# Patient Record
Sex: Female | Born: 1965 | Race: White | Hispanic: No | Smoking: Former smoker
Health system: Southern US, Community
[De-identification: ages and names within clinical notes are randomized; demographics above are authoritative.]

## PROBLEM LIST (undated history)

## (undated) DIAGNOSIS — A0472 Enterocolitis due to Clostridium difficile, not specified as recurrent: Secondary | ICD-10-CM

## (undated) DIAGNOSIS — K2270 Barrett's esophagus without dysplasia: Secondary | ICD-10-CM

## (undated) DIAGNOSIS — M519 Unspecified thoracic, thoracolumbar and lumbosacral intervertebral disc disorder: Secondary | ICD-10-CM

## (undated) DIAGNOSIS — B9681 Helicobacter pylori [H. pylori] as the cause of diseases classified elsewhere: Secondary | ICD-10-CM

## (undated) DIAGNOSIS — D509 Iron deficiency anemia, unspecified: Secondary | ICD-10-CM

## (undated) DIAGNOSIS — K279 Peptic ulcer, site unspecified, unspecified as acute or chronic, without hemorrhage or perforation: Secondary | ICD-10-CM

## (undated) DIAGNOSIS — G43909 Migraine, unspecified, not intractable, without status migrainosus: Secondary | ICD-10-CM

## (undated) DIAGNOSIS — Z21 Asymptomatic human immunodeficiency virus [HIV] infection status: Secondary | ICD-10-CM

## (undated) DIAGNOSIS — E876 Hypokalemia: Secondary | ICD-10-CM

## (undated) DIAGNOSIS — B370 Candidal stomatitis: Secondary | ICD-10-CM

## (undated) DIAGNOSIS — R197 Diarrhea, unspecified: Secondary | ICD-10-CM

## (undated) DIAGNOSIS — B20 Human immunodeficiency virus [HIV] disease: Secondary | ICD-10-CM

## (undated) DIAGNOSIS — F419 Anxiety disorder, unspecified: Secondary | ICD-10-CM

## (undated) DIAGNOSIS — Z9289 Personal history of other medical treatment: Secondary | ICD-10-CM

## (undated) DIAGNOSIS — R609 Edema, unspecified: Secondary | ICD-10-CM

## (undated) DIAGNOSIS — K2970 Gastritis, unspecified, without bleeding: Secondary | ICD-10-CM

## (undated) HISTORY — DX: Diarrhea, unspecified: R19.7

## (undated) HISTORY — DX: Asymptomatic human immunodeficiency virus (hiv) infection status: Z21

## (undated) HISTORY — DX: Anxiety disorder, unspecified: F41.9

## (undated) HISTORY — DX: Peptic ulcer, site unspecified, unspecified as acute or chronic, without hemorrhage or perforation: K27.9

## (undated) HISTORY — DX: Hypokalemia: E87.6

## (undated) HISTORY — DX: Helicobacter pylori (H. pylori) as the cause of diseases classified elsewhere: B96.81

## (undated) HISTORY — PX: LIVER BIOPSY: SHX301

## (undated) HISTORY — DX: Candidal stomatitis: B37.0

## (undated) HISTORY — DX: Human immunodeficiency virus (HIV) disease: B20

## (undated) HISTORY — DX: Gastritis, unspecified, without bleeding: K29.70

## (undated) HISTORY — DX: Enterocolitis due to Clostridium difficile, not specified as recurrent: A04.72

## (undated) HISTORY — DX: Barrett's esophagus without dysplasia: K22.70

---

## 2000-08-03 ENCOUNTER — Ambulatory Visit (HOSPITAL_COMMUNITY): Admission: RE | Admit: 2000-08-03 | Discharge: 2000-08-03 | Payer: Self-pay | Admitting: Infectious Diseases

## 2000-08-03 ENCOUNTER — Encounter: Admission: RE | Admit: 2000-08-03 | Discharge: 2000-08-03 | Payer: Self-pay | Admitting: Infectious Diseases

## 2000-08-23 ENCOUNTER — Encounter: Admission: RE | Admit: 2000-08-23 | Discharge: 2000-08-23 | Payer: Self-pay | Admitting: Infectious Diseases

## 2000-10-18 ENCOUNTER — Encounter: Admission: RE | Admit: 2000-10-18 | Discharge: 2000-10-18 | Payer: Self-pay | Admitting: Infectious Diseases

## 2000-10-18 ENCOUNTER — Ambulatory Visit (HOSPITAL_COMMUNITY): Admission: RE | Admit: 2000-10-18 | Discharge: 2000-10-18 | Payer: Self-pay | Admitting: Infectious Diseases

## 2008-05-23 HISTORY — PX: ERCP: SHX60

## 2008-05-23 HISTORY — PX: CHOLECYSTECTOMY: SHX55

## 2008-10-30 HISTORY — PX: COLONOSCOPY: SHX174

## 2009-01-07 ENCOUNTER — Encounter (INDEPENDENT_AMBULATORY_CARE_PROVIDER_SITE_OTHER): Payer: Self-pay | Admitting: Diagnostic Radiology

## 2009-01-07 ENCOUNTER — Ambulatory Visit (HOSPITAL_COMMUNITY): Admission: RE | Admit: 2009-01-07 | Discharge: 2009-01-07 | Payer: Self-pay | Admitting: Internal Medicine

## 2009-02-03 HISTORY — PX: ESOPHAGOGASTRODUODENOSCOPY ENDOSCOPY: SHX5814

## 2010-05-23 HISTORY — PX: ESOPHAGOGASTRODUODENOSCOPY: SHX1529

## 2010-08-28 LAB — CBC
MCHC: 34.1 g/dL (ref 30.0–36.0)
Platelets: 290 10*3/uL (ref 150–400)
RDW: 16.2 % — ABNORMAL HIGH (ref 11.5–15.5)

## 2010-08-28 LAB — PROTIME-INR
INR: 1 (ref 0.00–1.49)
Prothrombin Time: 12.6 seconds (ref 11.6–15.2)

## 2011-07-09 IMAGING — US US BIOPSY
1 series · 12 of 12 positions shown · non-contrast
Comparison: none

CLINICAL HISTORY: Elevated liver enzymes.  Recent cholecystectomy.

[Series 1: us biopsy · 0.24mm/px · 12 acquisitions, 12 frames shown]
[im 1/12]
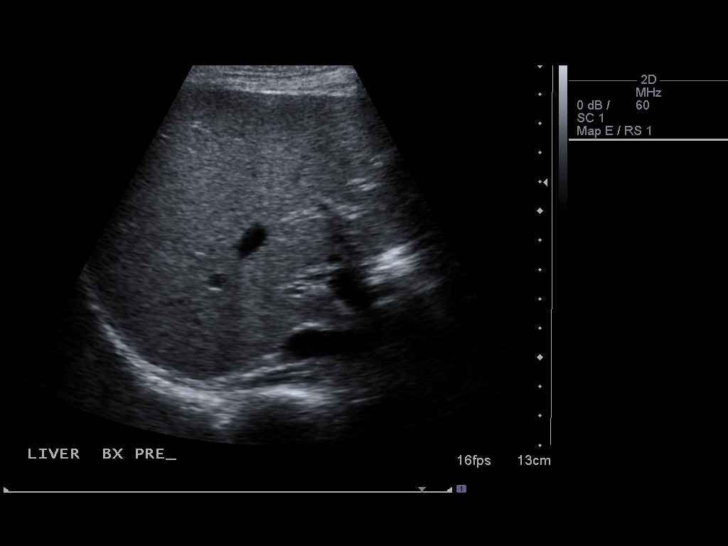
[im 2/12]
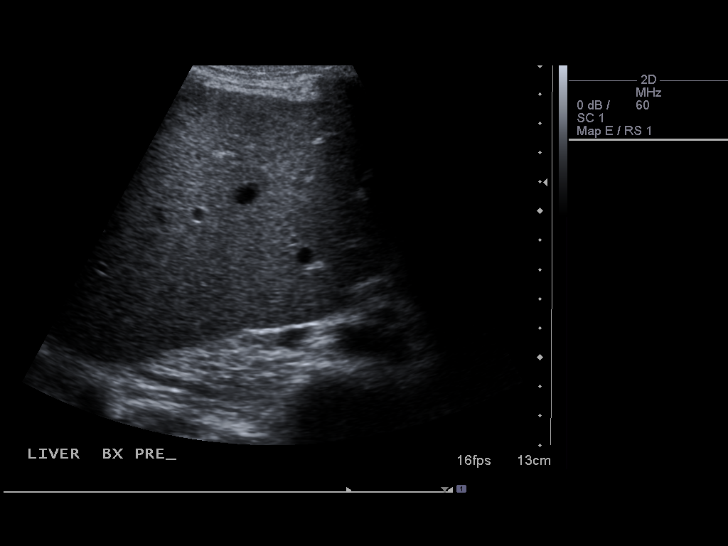
[im 3/12]
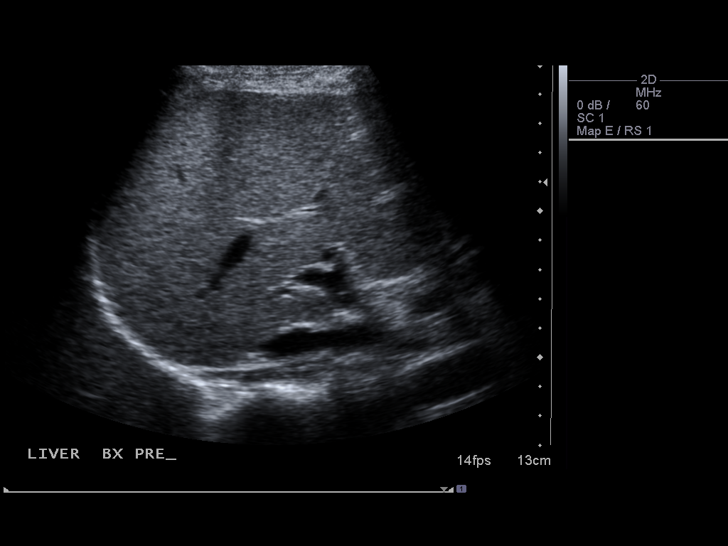
[im 4/12]
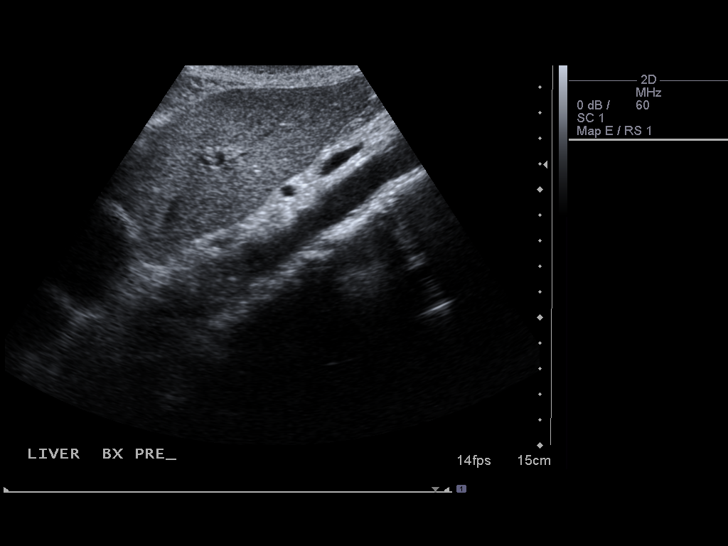
[im 5/12]
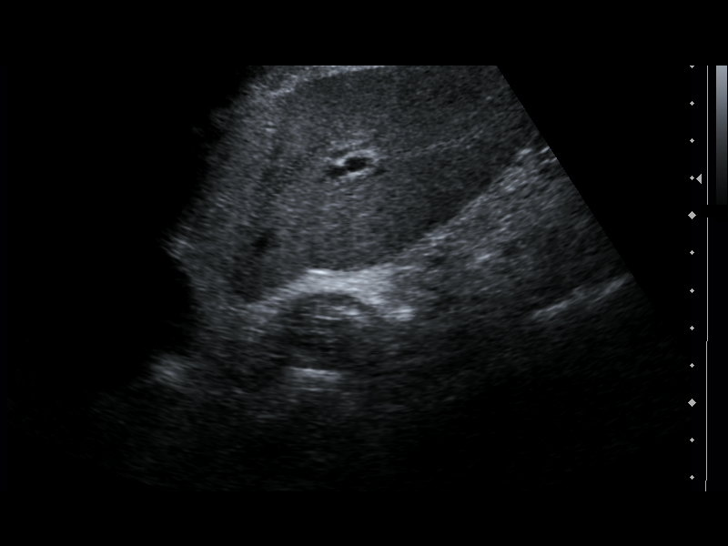
[im 6/12]
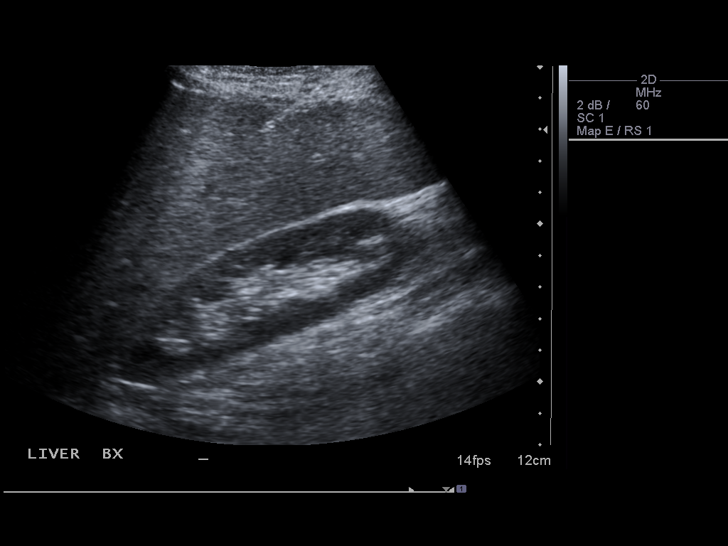
[im 7/12]
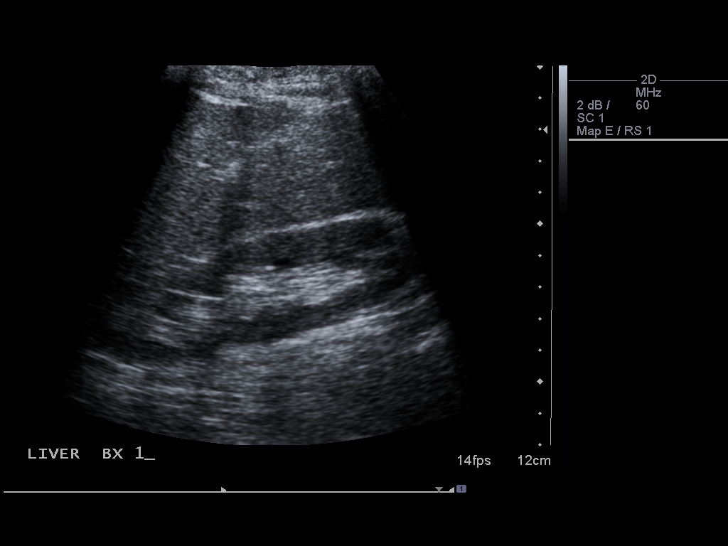
[im 8/12]
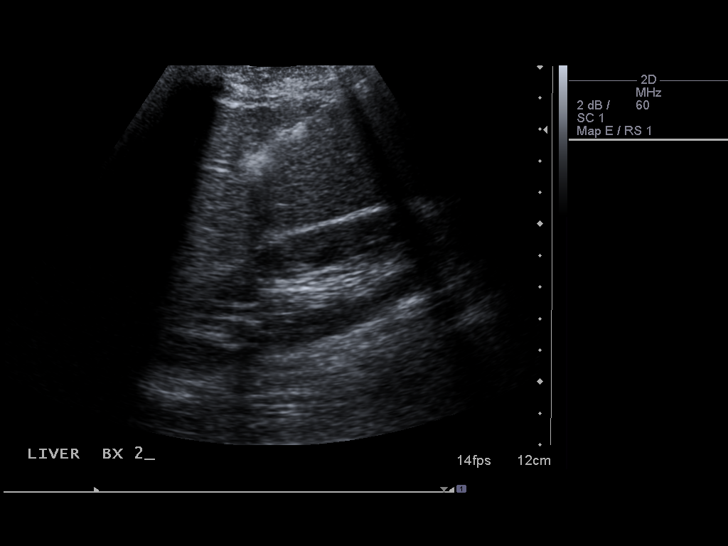
[im 9/12]
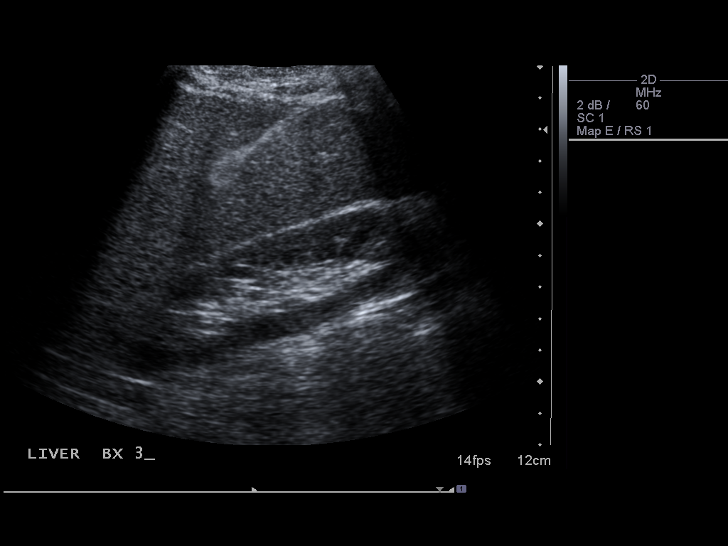
[im 10/12]
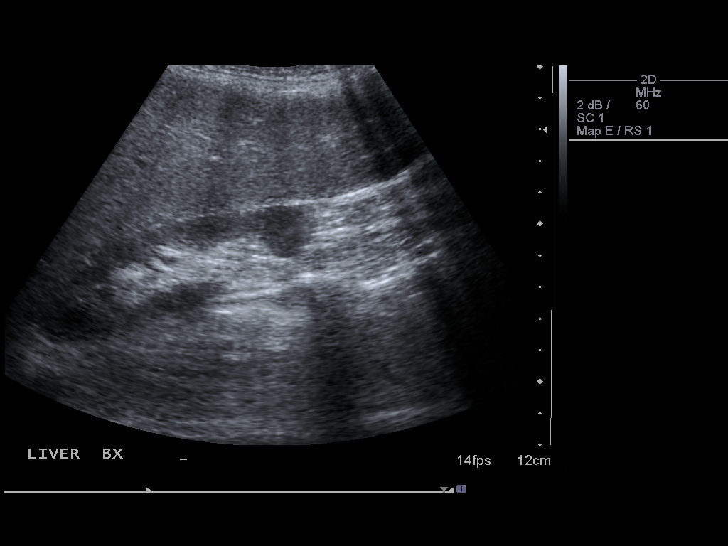
[im 11/12]
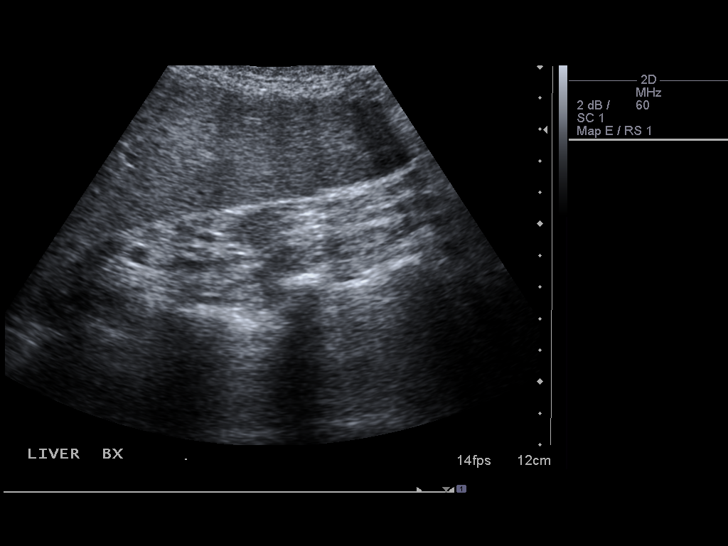
[im 12/12]
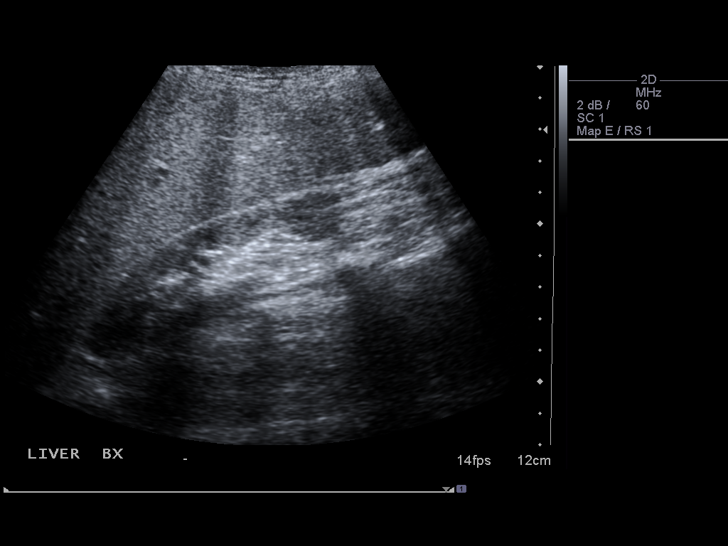

[12 of 12 positions shown; findings below may reference images not displayed]

PROCEDURE(S): ULTRASOUND GUIDED LIVER BIOPSY

Medications:Versed 3 mg, Fentanyl 100 mcg

Moderate sedation time:18 minutes

Fluoroscopy time: None

Procedure:The procedure was explained to the patient.  The risks
and benefits of the procedure were discussed and the patient's
questions were addressed.  Informed consent was obtained from the
patient. A radiology nurse monitered the patient for moderate
sedation. Liver was evaluated with ultrasound.  No evidence of
biliary dilatation.  The liver is prominent for size and extends
below the right costal margin.  The inferior right hepatic lobe was
targeted.  The right abdomen was prepped with Betadine and a
sterile drape was placed.  The skin and liver capsule were
anesthetized with 1% lidocaine.  A 17 gauge coaxial needle was
directed into the right hepatic lobe with ultrasound guidance.
Three 18 gauge core biopsies were obtained with real time
ultrasound visualization.  There were no immediate complications
and no evidence for active bleeding. Ultrasound images were taken
and saved for documentation.
IMPRESSION: Ultrasound guided random liver biopsy.

## 2012-05-11 HISTORY — PX: ENDARTERECTOMY: SHX5162

## 2012-05-11 HISTORY — PX: FLEXIBLE SIGMOIDOSCOPY: SHX1649

## 2014-08-26 ENCOUNTER — Encounter: Payer: Self-pay | Admitting: Gastroenterology

## 2014-10-01 ENCOUNTER — Ambulatory Visit: Payer: Self-pay | Admitting: Gastroenterology

## 2014-10-07 ENCOUNTER — Other Ambulatory Visit: Payer: Self-pay

## 2014-10-07 ENCOUNTER — Ambulatory Visit (INDEPENDENT_AMBULATORY_CARE_PROVIDER_SITE_OTHER): Payer: Medicare Other | Admitting: Nurse Practitioner

## 2014-10-07 ENCOUNTER — Encounter: Payer: Self-pay | Admitting: Nurse Practitioner

## 2014-10-07 VITALS — BP 101/71 | HR 84 | Temp 98.3°F | Ht 65.0 in | Wt 141.2 lb

## 2014-10-07 DIAGNOSIS — D649 Anemia, unspecified: Secondary | ICD-10-CM

## 2014-10-07 DIAGNOSIS — B20 Human immunodeficiency virus [HIV] disease: Secondary | ICD-10-CM

## 2014-10-07 DIAGNOSIS — R195 Other fecal abnormalities: Secondary | ICD-10-CM

## 2014-10-07 MED ORDER — PEG 3350-KCL-NA BICARB-NACL 420 G PO SOLR: 4000.0000 mL | Freq: Once | ORAL | Status: DC

## 2014-10-07 NOTE — Progress Notes (Signed)
Primary Care Physician:  Aggie Cosier, MD Primary Gastroenterologist:  Dr. Darrick Penna  Chief Complaint  Patient presents with  . Colonoscopy    transferring from Dr. Aleene Davidson    HPI:   49 year old female presents to transfer care from [facility]. Patient being seen for blood in stool. PCP notes and oncology notes reviewed. Last note by hematology oncology describes patient admission for symptomatically severe anemia with hemoglobin of 3.7 and ferratin 2.7 presumably secondary to chronic GI loss. Also with thrombus cytopenia. Attributed to chronic GI losses as well as multifactorial in etiology from severe iron deficiency state and adverse effects of highly active anti-retroviral therapy this patient is HIV positive. Per notes from last GI endoscopy, capsule endoscopy, and colonoscopy in summer 2015 did not identify any source of bleeding however her I follow-up testing in November 2015 was positive. Patient continued treatment with hematology oncology with improvement in symptoms. Last visit on 08/07/2014 describes patient feeling much better mild weakness and easy fatigability. Recommended pursuing follow-up with GI at [facility] which is still pending. Hemoglobin on 08/07/2014 was 9.3 which was previously 10.9 on 07/18/2014. Recommendations of hematology oncology was to keep appointment with gastroenterology for endoscopic evaluation of occult GI bleed. Patient has a very complicated medical history including cholecystectomy, liver damage, bile duct stenting, and others.  Today she states the above history is correct. Is seeing Heme/Onc and receiving IV iron once every couple to few months. Last GI provider was Dr. Adella Nissen December 2012 or 2013. Has had multiple exams in the past 5-6 years. She has a history of anemia and typically has a Hgb 8-9. Denies abdominal pain, N/V. Denies hematochezia and melena. States multiple stool studies have been done and "90% of the time  it's negative and 10% of the time its positive" for blood. Last endoscopic evaluation in 2012 was negative. Still with weakness, fatigue, dizziness. Denies syncope and near syncope. Denies chest pain, dyspnea. Has GERD but ID does not want her on PPI due to affecting ARV medication absorbsion. Denies any other upper or lower GI symptoms.   Past Medical History  Diagnosis Date  . Clostridium difficile colitis   . HIV (human immunodeficiency virus infection)   . Diarrhea   . Anxiety   . Hypokalemia   . Thrush   . Helicobacter pylori gastritis   . Barrett's esophagus     Past Surgical History  Procedure Laterality Date  . 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 Esophagogastroduodenoscopy endoscopy  02/03/2009    Duodenitis with hyperplasia and dilation of brunner's glands, H. Pylori gastritis, Barrett's esophagus  . Esophagogastroduodenoscopy  2012    At [facility]: ok (per patient)  . Endarterectomy  05/11/2012    benign duodenal mucosa, increased eosinophils in lamina propria; Esophagus benign squamous mucosa with increased eosinophils and fucal exudate  . Flexible sigmoidoscopy  05/11/2012    Benign colonic mucosa with suggestion of erosive features    Current Outpatient Prescriptions  Medication Sig Dispense Refill  . ALPRAZolam (XANAX) 1 MG tablet Take 1 mg by mouth 3 (three) times daily as needed for anxiety.    . cyclobenzaprine (FLEXERIL) 10 MG tablet Take 10 mg by mouth 3 (three) times daily as needed for muscle spasms.    . Darunavir Ethanolate (PREZISTA) 800 MG tablet Take 800 mg by mouth daily.    . diphenoxylate-atropine (LOMOTIL) 2.5-0.025 MG per tablet Take by mouth 4 (four) times daily as needed for diarrhea  or loose stools.    Marland Kitchen. Dolutegravir Sodium (TIVICAY PO) Take by mouth. 500 MG BID PER PT    . Doxepin HCl 3 MG TABS Take by mouth. One tablet nightly    . emtricitabine (EMTRIVA) 200 MG capsule Take 200 mg by mouth daily.    . furosemide  (LASIX) 20 MG tablet Take 20 mg by mouth. As needed    . hydrOXYzine (ATARAX/VISTARIL) 25 MG tablet Take 25 mg by mouth 3 (three) times daily as needed. Takes one daily    . ibuprofen (ADVIL,MOTRIN) 800 MG tablet Take 800 mg by mouth every 8 (eight) hours as needed.    . NON FORMULARY XAlZ   One tablet daily for sinus    . ondansetron (ZOFRAN) 4 MG tablet Take 4 mg by mouth every 8 (eight) hours as needed for nausea or vomiting.    . ritonavir (NORVIR) 100 MG capsule Take by mouth daily with breakfast.    . SUMAtriptan (IMITREX) 25 MG tablet Take 25 mg by mouth every 2 (two) hours as needed for migraine. May repeat in 2 hours if headache persists or recurs.    . polyethylene glycol-electrolytes (NULYTELY/GOLYTELY) 420 G solution Take 4,000 mLs by mouth once. 4000 mL 0   No current facility-administered medications for this visit.    Allergies as of 10/07/2014 - never reviewed  Allergen Reaction Noted  . Abacavir Other (See Comments) 10/07/2014  . Betadine [povidone iodine] Itching 10/07/2014  . Viread [tenofovir disoproxil fumarate] Other (See Comments) 10/07/2014    Family History  Problem Relation Age of Onset  . Colon cancer Neg Hx   . Heart disease Mother   . Lung cancer Father     History   Social History  . Marital Status: Divorced    Spouse Name: N/A  . Number of Children: N/A  . Years of Education: N/A   Occupational History  . Not on file.   Social History Main Topics  . Smoking status: Former Smoker    Quit date: 10/21/2013  . Smokeless tobacco: Not on file     Comment: Quit x 10 years  . Alcohol Use: No  . Drug Use: No  . Sexual Activity: Not on file   Other Topics Concern  . Not on file   Social History Narrative    Review of Systems: General: Negative for anorexia, weight loss, fever, chills. Eyes: Negative for vision changes.  ENT: Negative for hoarseness, difficulty swallowing. CV: Negative for chest pain, angina, palpitations, peripheral edema.  Positive for dyspnea on exertion.  Respiratory: Negative for dyspnea at rest, cough, sputum, wheezing.  GI: See history of present illness. MS: Negative for joint pain, low back pain.  Derm: Negative for rash or itching.  Neuro: Negative for weakness, memory loss, confusion.  Psych: Negative for anxiety, depression.  Endo: Negative for unusual weight change.  Heme: Negative for bruising or bleeding. Allergy: Negative for rash or hives.    Physical Exam: BP 101/71 mmHg  Pulse 84  Temp(Src) 98.3 F (36.8 C) (Oral)  Ht 5\' 5"  (1.651 m)  Wt 141 lb 3.2 oz (64.048 kg)  BMI 23.50 kg/m2 General:   Alert and oriented. Pleasant and cooperative. Well-nourished and well-developed.  Head:  Normocephalic and atraumatic. Eyes:  Without icterus, sclera clear and conjunctiva pink.  Ears:  Normal auditory acuity. Mouth:  No deformity or lesions, oral mucosa pink. No OP edema. No plaques/thrush. Neck:  Supple, without mass or thyromegaly. Lungs:  Clear to auscultation bilaterally. No  wheezes, rales, or rhonchi. No distress.  Heart:  S1, S2 present without murmurs appreciated.  Abdomen:  +BS, soft, non-tender and non-distended. No HSM noted. No guarding or rebound. No masses appreciated.  Rectal:  Deferred  Msk:  Symmetrical without gross deformities. Normal posture. Pulses:  Normal PT pulses noted. Extremities:  Without clubbing or edema. Neurologic:  Alert and  oriented x4;  grossly normal neurologically. Skin:  Intact without significant lesions or rashes. Psych:  Alert and cooperative. Normal mood and affect.     10/16/2014 1:23 PM

## 2014-10-07 NOTE — Patient Instructions (Signed)
1. We will schedule your procedure (colonoscopy with the possible endoscopy) for you today. 2. Please have your labs drawn in the next couple days. 3. Further recommendations to be based on results your procedure. 4. Return for routine evaluation in 3 months.

## 2014-10-08 LAB — CBC WITH DIFFERENTIAL/PLATELET
BASOS PCT: 1 % (ref 0–1)
Basophils Absolute: 0.1 10*3/uL (ref 0.0–0.1)
EOS ABS: 0.7 10*3/uL (ref 0.0–0.7)
EOS PCT: 11 % — AB (ref 0–5)
HCT: 33.4 % — ABNORMAL LOW (ref 36.0–46.0)
Hemoglobin: 10.5 g/dL — ABNORMAL LOW (ref 12.0–15.0)
Lymphocytes Relative: 17 % (ref 12–46)
Lymphs Abs: 1 10*3/uL (ref 0.7–4.0)
MCH: 27.1 pg (ref 26.0–34.0)
MCHC: 31.4 g/dL (ref 30.0–36.0)
MCV: 86.3 fL (ref 78.0–100.0)
MONOS PCT: 9 % (ref 3–12)
MPV: 11.1 fL (ref 8.6–12.4)
Monocytes Absolute: 0.5 10*3/uL (ref 0.1–1.0)
NEUTROS PCT: 62 % (ref 43–77)
Neutro Abs: 3.7 10*3/uL (ref 1.7–7.7)
Platelets: 185 10*3/uL (ref 150–400)
RBC: 3.87 MIL/uL (ref 3.87–5.11)
RDW: 16.3 % — AB (ref 11.5–15.5)
WBC: 6 10*3/uL (ref 4.0–10.5)

## 2014-10-08 LAB — COMPREHENSIVE METABOLIC PANEL
ALT: 20 U/L (ref 0–35)
AST: 25 U/L (ref 0–37)
Albumin: 4.2 g/dL (ref 3.5–5.2)
Alkaline Phosphatase: 134 U/L — ABNORMAL HIGH (ref 39–117)
BUN: 23 mg/dL (ref 6–23)
CHLORIDE: 109 meq/L (ref 96–112)
CO2: 21 mEq/L (ref 19–32)
CREATININE: 1.01 mg/dL (ref 0.50–1.10)
Calcium: 9.5 mg/dL (ref 8.4–10.5)
GLUCOSE: 93 mg/dL (ref 70–99)
POTASSIUM: 4 meq/L (ref 3.5–5.3)
Sodium: 142 mEq/L (ref 135–145)
TOTAL PROTEIN: 6.8 g/dL (ref 6.0–8.3)
Total Bilirubin: 0.6 mg/dL (ref 0.2–1.2)

## 2014-10-13 ENCOUNTER — Encounter (HOSPITAL_COMMUNITY): Payer: Self-pay | Admitting: Gastroenterology

## 2014-10-16 ENCOUNTER — Encounter: Payer: Self-pay | Admitting: Nurse Practitioner

## 2014-10-16 NOTE — Assessment & Plan Note (Addendum)
49 year old female with a history of HIV, iron deficiency anemia, and intermittent heme positive stools. Past colonoscopy suggested microscopic colitis and possible erosive features. Currently sees heme/onc her IV iron infusion. Does have a history of GERD but infectious disease is not water on PPI due to affecting the absorption of ARB medication regimens. Transient care from Dr. Aleene DavidsonSpainhour. Is having intermittent GI bleed and persistent anemia. At this point we'll proceed with a colonoscopy with possible endoscopy in the OR with propofol due to polypharmacy to further evaluate her anemia and GI bleed. At these are negative can consider possible capsule endoscopy on her follow-up visit to complete GI evaluation for anemia and GI bleed. We'll also recheck her CBC and CMP today.  Proceed with colonoscopy +/- EGD with Dr. Darrick PennaFields in the near future. The risks, benefits, and alternatives have been discussed in detail with the patient. They state understanding and desire to proceed.   Patient is currently on Xanax, hydroxyzine, Flexeril. Not on any anticoagulants. Not on any antidiabetic medications. We'll plan for OR sedation with MAC to ensure appropriate sedation.

## 2014-10-16 NOTE — Assessment & Plan Note (Signed)
Patient HIV positive with limits her treatment options for her GERD symptoms.

## 2014-10-16 NOTE — Assessment & Plan Note (Signed)
49-year-old female with a history of HIV, iron deficiency anemia, and intermittent heme positive stools. Past colonoscopy suggested microscopic colitis and possible erosive features. Currently sees heme/onc her IV iron infusion. Does have a history of GERD but infectious disease is not water on PPI due to affecting the absorption of ARB medication regimens. Transient care from Dr. Spainhour. Is having intermittent GI bleed and persistent anemia. At this point we'll proceed with a colonoscopy with possible endoscopy in the OR with propofol due to polypharmacy to further evaluate her anemia and GI bleed. At these are negative can consider possible capsule endoscopy on her follow-up visit to complete GI evaluation for anemia and GI bleed. We'll also recheck her CBC and CMP today.  Proceed with colonoscopy +/- EGD with Dr. Fields in the near future. The risks, benefits, and alternatives have been discussed in detail with the patient. They state understanding and desire to proceed.   Patient is currently on Xanax, hydroxyzine, Flexeril. Not on any anticoagulants. Not on any antidiabetic medications. We'll plan for OR sedation with MAC to ensure appropriate sedation.  

## 2014-10-22 NOTE — Patient Instructions (Addendum)
Kelli MunchKimberly B Vacha  10/22/2014    Your procedure is scheduled on 10/28/14.  [facility] at [facility]15 [facility].  Call this number if you have problems the morning of surgery:  [phone]   Remember:  Do not eat food or drink liquids after midnight.  Take these medicines the morning of surgery with A SIP OF WATER: You may take your Alprazolam, Cyclobenzaprine, Hydroxyzine and Ondansetron if needed.   Do not wear jewelry, make-up or nail polish.  Do not wear lotions, powders, or perfumes.  You may wear deodorant.  Do not shave 48 hours prior to surgery.  Men may shave face and neck.  Do not bring valuables to the hospital.  [facility] any belongings or valuables.  Contacts, dentures or bridgework may not be worn into surgery.  Leave your suitcase in the car.  After surgery it may be brought to your room.  For patients admitted to the hospital, discharge time will be determined by your treatment team.  Patients discharged the day of surgery will not be allowed to drive home.   Special instructions: Start your bowel prep as directed by your Gastrointestinal doctor.  Please read over the following fact sheets that you were given. Anesthesia Post-op Instructions and Care and Recovery After Surgery   Esophagogastroduodenoscopy Esophagogastroduodenoscopy (EGD) is a procedure to examine the lining of the esophagus, stomach, and first part of the small intestine (duodenum). A long, flexible, lighted tube with a camera attached (endoscope) is inserted down the throat to view these organs. This procedure is done to detect problems or abnormalities, such as inflammation, bleeding, ulcers, or growths, in order to treat them. The procedure lasts about 5-20 minutes. It is usually an outpatient procedure, but it may need to be performed in emergency cases in the hospital. LET YOUR CAREGIVER KNOW ABOUT:   Allergies to food or medicine.  All medicines you are taking, including  vitamins, herbs, eyedrops, and over-the-counter medicines and creams.  Use of steroids (by mouth or creams).  Previous problems you or members of your family have had with the use of anesthetics.  Any blood disorders you have.  Previous surgeries you have had.  Other health problems you have.  Possibility of pregnancy, if this applies. RISKS AND COMPLICATIONS  Generally, EGD is a safe procedure. However, as with any procedure, complications can occur. Possible complications include:  Infection.  Bleeding.  Tearing (perforation) of the esophagus, stomach, or duodenum.  Difficulty breathing or not being able to breath.  Excessive sweating.  Spasms of the larynx.  Slowed heartbeat.  Low blood pressure. BEFORE THE PROCEDURE  Do not eat or drink anything for 6-8 hours before the procedure or as directed by your caregiver.  Ask your caregiver about changing or stopping your regular medicines.  If you wear dentures, be prepared to remove them before the procedure.  Arrange for someone to drive you home after the procedure. PROCEDURE   A vein will be accessed to give medicines and fluids. A medicine to relax you (sedative) and a pain reliever will be given through that access into the vein.  A numbing medicine (local anesthetic) may be sprayed on your throat for comfort and to stop you from gagging or coughing.  A mouth guard may be placed in your mouth to protect your teeth and to keep you from biting on the endoscope.  You will be asked to lie on your left side.  The endoscope is inserted  down your throat and into the esophagus, stomach, and duodenum.  Air is put through the endoscope to allow your caregiver to view the lining of your esophagus clearly.  The esophagus, stomach, and duodenum is then examined. During the exam, your caregiver may:  Remove tissue to be examined under a microscope (biopsy) for inflammation, infection, or other medical problems.  Remove  growths.  Remove objects (foreign bodies) that are stuck.  Treat any bleeding with medicines or other devices that stop tissues from bleeding (hot cautery, clipping devices).  Widen (dilate) or stretch narrowed areas of the esophagus and stomach.  The endoscope will then be withdrawn. AFTER THE PROCEDURE  You will be taken to a recovery area to be monitored. You will be able to go home once you are stable and alert.  Do not eat or drink anything until the local anesthetic and numbing medicines have worn off. You may choke.  It is normal to feel bloated, have pain with swallowing, or have a sore throat for a short time. This will wear off.  Your caregiver should be able to discuss his or her findings with you. It will take longer to discuss the test results if any biopsies were taken. Document Released: 09/09/2004 Document Revised: 09/23/2013 Document Reviewed: 04/11/2012 [facility] Patient Information 2015 [place], [place]. This information is not intended to replace advice given to you by your health care provider. Make sure you discuss any questions you have with your health care provider.    Colonoscopy A colonoscopy is an exam to look at the entire large intestine (colon). This exam can help find problems such as tumors, polyps, inflammation, and areas of bleeding. The exam takes about 1 hour.  LET [facility] CARE PROVIDER KNOW ABOUT:   Any allergies you have.  All medicines you are taking, including vitamins, herbs, eye drops, creams, and over-the-counter medicines.  Previous problems you or members of your family have had with the use of anesthetics.  Any blood disorders you have.  Previous surgeries you have had.  Medical conditions you have. RISKS AND COMPLICATIONS  Generally, this is a safe procedure. However, as with any procedure, complications can occur. Possible complications include:  Bleeding.  Tearing or rupture of the colon wall.  Reaction to medicines  given during the exam.  Infection (rare). BEFORE THE PROCEDURE   Ask your health care provider about changing or stopping your regular medicines.  You may be prescribed an oral bowel prep. This involves drinking a large amount of medicated liquid, starting the day before your procedure. The liquid will cause you to have multiple loose stools until your stool is almost clear or light green. This cleans out your colon in preparation for the procedure.  Do not eat or drink anything else once you have started the bowel prep, unless your health care provider tells you it is safe to do so.  Arrange for someone to drive you home after the procedure. PROCEDURE   You will be given medicine to help you relax (sedative).  You will lie on your side with your knees bent.  A long, flexible tube with a light and camera on the end (colonoscope) will be inserted through the rectum and into the colon. The camera sends video back to a computer screen as it moves through the colon. The colonoscope also releases carbon dioxide gas to inflate the colon. This helps your health care provider see the area better.  During the exam, your health care provider may take  a small tissue sample (biopsy) to be examined under a microscope if any abnormalities are found.  The exam is finished when the entire colon has been viewed. AFTER THE PROCEDURE   Do not drive for 24 hours after the exam.  You may have a small amount of blood in your stool.  You may pass moderate amounts of gas and have mild abdominal cramping or bloating. This is caused by the gas used to inflate your colon during the exam.  Ask when your test results will be ready and how you will get your results. Make sure you get your test results. Document Released: 05/06/2000 Document Revised: 02/27/2013 Document Reviewed: 01/14/2013 [facility] Patient Information 2015 [place], [place]. This information is not intended to replace advice given to you by your  health care provider. Make sure you discuss any questions you have with your health care provider.    Monitored Anesthesia Care Monitored anesthesia care is an anesthesia service for a medical procedure. Anesthesia is the loss of the ability to feel pain. It is produced by medicines called anesthetics. It may affect a small area of your body (local anesthesia), a large area of your body (regional anesthesia), or your entire body (general anesthesia). The need for monitored anesthesia care depends your procedure, your condition, and the potential need for regional or general anesthesia. It is often provided during procedures where:   General anesthesia may be needed if there are complications. This is because you need special care when you are under general anesthesia.   You will be under local or regional anesthesia. This is so that you are able to have higher levels of anesthesia if needed.   You will receive calming medicines (sedatives). This is especially the case if sedatives are given to put you in a semi-conscious state of relaxation (deep sedation). This is because the amount of sedative needed to produce this state can be hard to predict. Too much of a sedative can produce general anesthesia. Monitored anesthesia care is performed by one or more health care providers who have special training in all types of anesthesia. You will need to meet with these health care providers before your procedure. During this meeting, they will ask you about your medical history. They will also give you instructions to follow. (For example, you will need to stop eating and drinking before your procedure. You may also need to stop or change medicines you are taking.) During your procedure, your health care providers will stay with you. They will:   Watch your condition. This includes watching your blood pressure, breathing, and level of pain.   Diagnose and treat problems that occur.   Give medicines if  they are needed. These may include calming medicines (sedatives) and anesthetics.   Make sure you are comfortable.  Having monitored anesthesia care does not necessarily mean that you will be under anesthesia. It does mean that your health care providers will be able to manage anesthesia if you need it or if it occurs. It also means that you will be able to have a different type of anesthesia than you are having if you need it. When your procedure is complete, your health care providers will continue to watch your condition. They will make sure any medicines wear off before you are allowed to go home.  Document Released: 02/02/2005 Document Revised: 09/23/2013 Document Reviewed: 06/20/2012 [facility] Patient Information 2015 [place], [place]. This information is not intended to replace advice given to you by your health care  provider. Make sure you discuss any questions you have with your health care provider.

## 2014-10-23 ENCOUNTER — Encounter (HOSPITAL_COMMUNITY)
Admission: RE | Admit: 2014-10-23 | Discharge: 2014-10-23 | Disposition: A | Discharge status: Home / Self Care | Payer: Medicare Other | Source: Ambulatory Visit | Attending: Gastroenterology | Admitting: Gastroenterology

## 2014-10-23 ENCOUNTER — Encounter (HOSPITAL_COMMUNITY): Payer: Self-pay

## 2014-10-23 DIAGNOSIS — D649 Anemia, unspecified: Secondary | ICD-10-CM | POA: Insufficient documentation

## 2014-10-23 DIAGNOSIS — Z01818 Encounter for other preprocedural examination: Secondary | ICD-10-CM | POA: Diagnosis not present

## 2014-10-23 DIAGNOSIS — R195 Other fecal abnormalities: Secondary | ICD-10-CM | POA: Diagnosis not present

## 2014-10-23 DIAGNOSIS — B20 Human immunodeficiency virus [HIV] disease: Secondary | ICD-10-CM | POA: Insufficient documentation

## 2014-10-23 HISTORY — DX: Personal history of other medical treatment: Z92.89

## 2014-10-23 HISTORY — DX: Migraine, unspecified, not intractable, without status migrainosus: G43.909

## 2014-10-23 HISTORY — DX: Iron deficiency anemia, unspecified: D50.9

## 2014-10-23 HISTORY — DX: Unspecified thoracic, thoracolumbar and lumbosacral intervertebral disc disorder: M51.9

## 2014-10-23 HISTORY — DX: Edema, unspecified: R60.9

## 2014-10-23 LAB — BASIC METABOLIC PANEL
ANION GAP: 7 (ref 5–15)
BUN: 25 mg/dL — AB (ref 6–20)
CO2: 23 mmol/L (ref 22–32)
CREATININE: 1.09 mg/dL — AB (ref 0.44–1.00)
Calcium: 9.2 mg/dL (ref 8.9–10.3)
Chloride: 108 mmol/L (ref 101–111)
GFR calc non Af Amer: 58 mL/min — ABNORMAL LOW (ref >60)
GLUCOSE: 99 mg/dL (ref 65–99)
Potassium: 3.8 mmol/L (ref 3.5–5.1)
Sodium: 138 mmol/L (ref 135–145)

## 2014-10-23 LAB — CBC
HCT: 33.9 % — ABNORMAL LOW (ref 36.0–46.0)
Hemoglobin: 10.1 g/dL — ABNORMAL LOW (ref 12.0–15.0)
MCH: 26 pg (ref 26.0–34.0)
MCHC: 29.8 g/dL — ABNORMAL LOW (ref 30.0–36.0)
MCV: 87.1 fL (ref 78.0–100.0)
PLATELETS: 166 10*3/uL (ref 150–400)
RBC: 3.89 MIL/uL (ref 3.87–5.11)
RDW: 16.2 % — ABNORMAL HIGH (ref 11.5–15.5)
WBC: 5.8 10*3/uL (ref 4.0–10.5)

## 2014-10-28 ENCOUNTER — Ambulatory Visit (HOSPITAL_COMMUNITY): Payer: Medicare Other | Admitting: Anesthesiology

## 2014-10-28 ENCOUNTER — Ambulatory Visit (HOSPITAL_COMMUNITY)
Admission: RE | Admit: 2014-10-28 | Discharge: 2014-10-28 | Disposition: A | Discharge status: Home / Self Care | Payer: Medicare Other | Source: Ambulatory Visit | Attending: Gastroenterology | Admitting: Gastroenterology

## 2014-10-28 ENCOUNTER — Telehealth: Payer: Self-pay

## 2014-10-28 ENCOUNTER — Encounter (HOSPITAL_COMMUNITY)
Admission: RE | Disposition: A | Discharge status: Home / Self Care | Payer: Self-pay | Source: Ambulatory Visit | Attending: Gastroenterology

## 2014-10-28 ENCOUNTER — Encounter (HOSPITAL_COMMUNITY): Payer: Self-pay | Admitting: Emergency Medicine

## 2014-10-28 DIAGNOSIS — Z888 Allergy status to other drugs, medicaments and biological substances status: Secondary | ICD-10-CM | POA: Insufficient documentation

## 2014-10-28 DIAGNOSIS — D649 Anemia, unspecified: Secondary | ICD-10-CM | POA: Diagnosis present

## 2014-10-28 DIAGNOSIS — K2970 Gastritis, unspecified, without bleeding: Secondary | ICD-10-CM | POA: Diagnosis not present

## 2014-10-28 DIAGNOSIS — Z87891 Personal history of nicotine dependence: Secondary | ICD-10-CM | POA: Insufficient documentation

## 2014-10-28 DIAGNOSIS — Z9049 Acquired absence of other specified parts of digestive tract: Secondary | ICD-10-CM | POA: Insufficient documentation

## 2014-10-28 DIAGNOSIS — Z21 Asymptomatic human immunodeficiency virus [HIV] infection status: Secondary | ICD-10-CM | POA: Diagnosis not present

## 2014-10-28 DIAGNOSIS — F419 Anxiety disorder, unspecified: Secondary | ICD-10-CM | POA: Diagnosis not present

## 2014-10-28 DIAGNOSIS — K648 Other hemorrhoids: Secondary | ICD-10-CM | POA: Insufficient documentation

## 2014-10-28 DIAGNOSIS — K2990 Gastroduodenitis, unspecified, without bleeding: Secondary | ICD-10-CM

## 2014-10-28 DIAGNOSIS — K2950 Unspecified chronic gastritis without bleeding: Secondary | ICD-10-CM | POA: Insufficient documentation

## 2014-10-28 DIAGNOSIS — K649 Unspecified hemorrhoids: Secondary | ICD-10-CM | POA: Diagnosis not present

## 2014-10-28 DIAGNOSIS — K2980 Duodenitis without bleeding: Secondary | ICD-10-CM | POA: Insufficient documentation

## 2014-10-28 DIAGNOSIS — E876 Hypokalemia: Secondary | ICD-10-CM | POA: Diagnosis not present

## 2014-10-28 HISTORY — PX: ESOPHAGOGASTRODUODENOSCOPY (EGD) WITH PROPOFOL: SHX5813

## 2014-10-28 HISTORY — PX: COLONOSCOPY WITH PROPOFOL: SHX5780

## 2014-10-28 HISTORY — PX: BIOPSY: SHX5522

## 2014-10-28 LAB — FERRITIN: FERRITIN: 8 ng/mL — AB (ref 11–307)

## 2014-10-28 SURGERY — COLONOSCOPY WITH PROPOFOL
Anesthesia: Monitor Anesthesia Care | Site: Esophagus

## 2014-10-28 MED ORDER — FENTANYL CITRATE (PF) 100 MCG/2ML IJ SOLN
INTRAMUSCULAR | Status: AC
  Filled 2014-10-28: qty 2

## 2014-10-28 MED ORDER — MIDAZOLAM HCL 2 MG/2ML IJ SOLN
1.0000 mg | INTRAMUSCULAR | Status: DC | PRN
Start: 2014-10-28 — End: 2014-10-28
  Administered 2014-10-28 (×2): 1 mg via INTRAVENOUS

## 2014-10-28 MED ORDER — MIDAZOLAM HCL 2 MG/2ML IJ SOLN
INTRAMUSCULAR | Status: AC
  Filled 2014-10-28: qty 2

## 2014-10-28 MED ORDER — ESOMEPRAZOLE MAGNESIUM 40 MG PO CPDR: 40.0000 mg | DELAYED_RELEASE_CAPSULE | Freq: Two times a day (BID) | ORAL | Status: DC

## 2014-10-28 MED ORDER — MIDAZOLAM HCL 5 MG/5ML IJ SOLN
INTRAMUSCULAR | Status: DC | PRN
  Administered 2014-10-28 (×2): 1 mg via INTRAVENOUS

## 2014-10-28 MED ORDER — LIDOCAINE HCL (CARDIAC) 20 MG/ML IV SOLN
INTRAVENOUS | Status: DC | PRN
  Administered 2014-10-28: 25 mg via INTRAVENOUS

## 2014-10-28 MED ORDER — LIDOCAINE VISCOUS 2 % MT SOLN
6.0000 mL | Freq: Once | OROMUCOSAL | Status: AC
  Administered 2014-10-28: 6 mL via OROMUCOSAL

## 2014-10-28 MED ORDER — FENTANYL CITRATE (PF) 100 MCG/2ML IJ SOLN: 25.0000 ug | INTRAMUSCULAR | Status: DC | PRN

## 2014-10-28 MED ORDER — WATER FOR IRRIGATION, STERILE IR SOLN
Status: DC | PRN
  Administered 2014-10-28: 1000 mL via SURGICAL_CAVITY

## 2014-10-28 MED ORDER — ONDANSETRON HCL 4 MG/2ML IJ SOLN
INTRAMUSCULAR | Status: AC
  Filled 2014-10-28: qty 2

## 2014-10-28 MED ORDER — FENTANYL CITRATE (PF) 250 MCG/5ML IJ SOLN
INTRAMUSCULAR | Status: DC | PRN
  Administered 2014-10-28 (×4): 25 ug via INTRAVENOUS

## 2014-10-28 MED ORDER — ONDANSETRON HCL 4 MG/2ML IJ SOLN
4.0000 mg | Freq: Once | INTRAMUSCULAR | Status: AC
  Administered 2014-10-28: 4 mg via INTRAVENOUS

## 2014-10-28 MED ORDER — LIDOCAINE VISCOUS 2 % MT SOLN
OROMUCOSAL | Status: AC
Start: 2014-10-28 — End: 2014-10-28
  Administered 2014-10-28: 6 mL via OROMUCOSAL
  Filled 2014-10-28: qty 15

## 2014-10-28 MED ORDER — ONDANSETRON HCL 4 MG/2ML IJ SOLN: 4.0000 mg | Freq: Once | INTRAMUSCULAR | Status: DC | PRN

## 2014-10-28 MED ORDER — PROPOFOL 10 MG/ML IV BOLUS
INTRAVENOUS | Status: AC
  Filled 2014-10-28: qty 20

## 2014-10-28 MED ORDER — PROPOFOL INFUSION 10 MG/ML OPTIME
INTRAVENOUS | Status: DC | PRN
  Administered 2014-10-28: 09:00:00 via INTRAVENOUS
  Administered 2014-10-28: 150 ug/kg/min via INTRAVENOUS
  Administered 2014-10-28: 09:00:00 via INTRAVENOUS

## 2014-10-28 MED ORDER — FENTANYL CITRATE (PF) 100 MCG/2ML IJ SOLN
25.0000 ug | INTRAMUSCULAR | Status: AC
  Administered 2014-10-28 (×2): 25 ug via INTRAVENOUS

## 2014-10-28 MED ORDER — LIDOCAINE HCL (PF) 1 % IJ SOLN
INTRAMUSCULAR | Status: AC
  Filled 2014-10-28: qty 5

## 2014-10-28 MED ORDER — STERILE WATER FOR IRRIGATION IR SOLN
Status: DC | PRN
  Administered 2014-10-28: 1000 mL

## 2014-10-28 MED ORDER — LACTATED RINGERS IV SOLN
INTRAVENOUS | Status: DC
  Administered 2014-10-28: 75 mL/h via INTRAVENOUS

## 2014-10-28 SURGICAL SUPPLY — 23 items
BLOCK BITE 60FR ADLT L/F BLUE (MISCELLANEOUS) ×5 IMPLANT
ELECT REM PT RETURN 9FT ADLT (ELECTROSURGICAL)
ELECTRODE REM PT RTRN 9FT ADLT (ELECTROSURGICAL) IMPLANT
FCP BXJMBJMB 240X2.8X (CUTTING FORCEPS)
FLOOR PAD 36X40 (MISCELLANEOUS) ×5
FORCEPS BIOP RAD 4 LRG CAP 4 (CUTTING FORCEPS) ×10 IMPLANT
FORCEPS BIOP RJ4 240 W/NDL (CUTTING FORCEPS)
FORCEPS BXJMBJMB 240X2.8X (CUTTING FORCEPS) IMPLANT
FORMALIN 10 PREFIL 20ML (MISCELLANEOUS) ×20 IMPLANT
INJECTOR/SNARE I SNARE (MISCELLANEOUS) IMPLANT
KIT ENDO PROCEDURE PEN (KITS) ×5 IMPLANT
MANIFOLD NEPTUNE II (INSTRUMENTS) ×5 IMPLANT
NEEDLE SCLEROTHERAPY 25GX240 (NEEDLE) IMPLANT
OVERTUBE ENDOCUFF GREEN (MISCELLANEOUS) ×5 IMPLANT
PAD FLOOR 36X40 (MISCELLANEOUS) ×3 IMPLANT
PROBE APC STR FIRE (PROBE) IMPLANT
PROBE INJECTION GOLD (MISCELLANEOUS)
PROBE INJECTION GOLD 7FR (MISCELLANEOUS) IMPLANT
SNARE SHORT THROW 13M SML OVAL (MISCELLANEOUS) IMPLANT
SYR INFLATION 60ML (SYRINGE) IMPLANT
TRAP SPECIMEN MUCOUS 40CC (MISCELLANEOUS) IMPLANT
TUBING IRRIGATION ENDOGATOR (MISCELLANEOUS) ×5 IMPLANT
WATER STERILE IRR 1000ML POUR (IV SOLUTION) ×10 IMPLANT

## 2014-10-28 NOTE — Progress Notes (Signed)
Awake. Talking. Swallowing without difficulty. Voices no c/o at this time.

## 2014-10-28 NOTE — Transfer of Care (Signed)
Immediate Anesthesia Transfer of Care Note  Patient: Kelli Garcia  Procedure(s) Performed: Procedure(s) with comments: COLONOSCOPY WITH PROPOFOL (N/A) - In cecum @ 0908, out @ 0924 ESOPHAGOGASTRODUODENOSCOPY (EGD) WITH PROPOFOL (N/A) BIOPSY (N/A)  Patient Location: PACU  Anesthesia Type:MAC  Level of Consciousness: awake, alert  and oriented  Airway & Oxygen Therapy: Patient Spontanous Breathing  Post-op Assessment: Report given to RN  Post vital signs: Reviewed  Last Vitals:  Filed Vitals:   10/28/14 0830  BP: 102/72  Pulse:   Temp:   Resp: 14    Complications: No apparent anesthesia complications

## 2014-10-28 NOTE — Telephone Encounter (Signed)
PLEASE CALL PT. I TOOK BIOPSIES FROM JUST INSIDE HER RECTUM. SHE MAY SEE SOME RECTAL BLEEDING. IT SHOULD LIGHT UP OVER THE NEXT 2-3 DAYS. SHE SHOULD GO TO THE ED IF SHE HAS HEAVY BLEEDING./

## 2014-10-28 NOTE — Progress Notes (Signed)
REVIEWED-NO ADDITIONAL RECOMMENDATIONS. 

## 2014-10-28 NOTE — Discharge Instructions (Signed)
You have STOMACH ULCERS, gastritis & duodenitis DUE TO IBUPROFEN USE. You have LARGE internal hemorrhoids.  I BIOPSIED YOUR STOMACH, SMALL BOWEL, AND COLON.    FOLLOW A LOW FAT/HIGH FIBER DIET. AVOID ITEMS THAT CAUSE BLOATING. SEE INFO BELOW.  START NEXIUM. TAKE 30 MINUTES PRIOR TO MEALS TWICE DAILY.  NO ASPIRIN, BC/GOODY POWDERS, IBUPROFEN/MOTRIN, OR NAPROXEN/ALEVE UNTIL JUN 28.  WE WILL CHECK YOUR IRON STORES TODAY.  AVOID ITEMS THAT TRIGGER GASTRITIS & ULCERS. SEE INFO BELOW.  YOUR BIOPSY RESULTS WILL BE AVAILABLE IN MY CHART AFTER JUN 9 AND MY OFFICE WILL CONTACT YOU IN 10-14 DAYS WITH YOUR RESULTS.   FOLLOW UP IN 3 MOS. YOU WILL NEED A REPEAT UPPER ENDOSCOPY SCHEDULED AFTER YOUR NEXT VISIT.   ENDOSCOPY Care After Read the instructions outlined below and refer to this sheet in the next week. These discharge instructions provide you with general information on caring for yourself after you leave the hospital. While your treatment has been planned according to the most current medical practices available, unavoidable complications occasionally occur. If you have any problems or questions after discharge, call DR. Chenae Brager, [phone].  ACTIVITY  You may resume your regular activity, but move at a slower pace for the next 24 hours.   Take frequent rest periods for the next 24 hours.   Walking will help get rid of the air and reduce the bloated feeling in your belly (abdomen).   No driving for 24 hours (because of the medicine (anesthesia) used during the test).   You may shower.   Do not sign any important legal documents or operate any machinery for 24 hours (because of the anesthesia used during the test).    NUTRITION  Drink plenty of fluids.   You may resume your normal diet as instructed by your doctor.   Begin with a light meal and progress to your normal diet. Heavy or fried foods are harder to digest and may make you feel sick to your stomach (nauseated).   Avoid  alcoholic beverages for 24 hours or as instructed.    MEDICATIONS  You may resume your normal medications.   WHAT YOU CAN EXPECT TODAY  Some feelings of bloating in the abdomen.   Passage of more gas than usual.   Spotting of blood in your stool or on the toilet paper  .  IF YOU HAD POLYPS REMOVED DURING THE ENDOSCOPY:  Eat a soft diet IF YOU HAVE NAUSEA, BLOATING, ABDOMINAL PAIN, OR VOMITING.    FINDING OUT THE RESULTS OF YOUR TEST Not all test results are available during your visit. DR. Darrick Penna WILL CALL YOU WITHIN 14 DAYS OF YOUR PROCEDUE WITH YOUR RESULTS. Do not assume everything is normal if you have not heard from DR. Nalaysia Manganiello, CALL HER OFFICE AT [phone].  SEEK IMMEDIATE MEDICAL ATTENTION AND CALL THE OFFICE: [phone] IF:  You have more than a spotting of blood in your stool.   Your belly is swollen (abdominal distention).   You are nauseated or vomiting.   You have a temperature over 101F.   You have abdominal pain or discomfort that is severe or gets worse throughout the day.   Gastritis/DUODENITIS/ULCERS  Gastritis/ULCERS ARE inflammation (the body's way of reacting to injury and/or infection) of the stomach. DUODENITIS is an inflammation (the body's way of reacting to injury and/or infection) of the FIRST PART OF THE SMALL INTESTINES. It is often caused by bacterial (germ) infections. It can also be caused BY ASPIRIN, BC/GOODY POWDER'S, (IBUPROFEN) MOTRIN,  OR ALEVE (NAPROXEN), chemicals (including alcohol), SPICY FOODS, and medications. This illness may be associated with generalized malaise (feeling tired, not well), UPPER ABDOMINAL STOMACH cramps, and fever. One common bacterial cause of gastritis is an organism known as H. Pylori. This can be treated with antibiotics.    High-Fiber Diet A high-fiber diet changes your normal diet to include more whole grains, legumes, fruits, and vegetables. Changes in the diet involve replacing refined carbohydrates  with unrefined foods. The calorie level of the diet is essentially unchanged. The Dietary Reference Intake (recommended amount) for adult males is 38 grams per day. For adult females, it is 25 grams per day. Pregnant and lactating women should consume 28 grams of fiber per day. Fiber is the intact part of a plant that is not broken down during digestion. Functional fiber is fiber that has been isolated from the plant to provide a beneficial effect in the body. PURPOSE  Increase stool bulk.   Ease and regulate bowel movements.   Lower cholesterol.   REDUCE RISK OF COLON CANCER  INDICATIONS THAT YOU NEED MORE FIBER  Constipation and hemorrhoids.   Uncomplicated diverticulosis (intestine condition) and irritable bowel syndrome.    GUIDELINES FOR INCREASING FIBER IN THE DIET  Start adding fiber to the diet slowly. A gradual increase of about 5 more grams (2 slices of whole-wheat bread, 2 servings of most fruits or vegetables, or 1 bowl of high-fiber cereal) per day is best. Too rapid an increase in fiber may result in constipation, flatulence, and bloating.   Drink enough water and fluids to keep your urine clear or pale yellow. Water, juice, or caffeine-free drinks are recommended. Not drinking enough fluid may cause constipation.   Eat a variety of high-fiber foods rather than one type of fiber.   Try to increase your intake of fiber through using high-fiber foods rather than fiber pills or supplements that contain small amounts of fiber.   The goal is to change the types of food eaten. Do not supplement your present diet with high-fiber foods, but replace foods in your present diet.   INCLUDE A VARIETY OF FIBER SOURCES  Replace refined and processed grains with whole grains, canned fruits with fresh fruits, and incorporate other fiber sources. White rice, white breads, and most bakery goods contain little or no fiber.   Brown whole-grain rice, buckwheat oats, and many fruits and  vegetables are all good sources of fiber. These include: broccoli, Brussels sprouts, cabbage, cauliflower, beets, sweet potatoes, white potatoes (skin on), carrots, tomatoes, eggplant, squash, berries, fresh fruits, and dried fruits.   Cereals appear to be the richest source of fiber. Cereal fiber is found in whole grains and bran. Bran is the fiber-rich outer coat of cereal grain, which is largely removed in refining. In whole-grain cereals, the bran remains. In breakfast cereals, the largest amount of fiber is found in those with "bran" in their names. The fiber content is sometimes indicated on the label.   You may need to include additional fruits and vegetables each day.   In baking, for 1 cup white flour, you may use the following substitutions:   1 cup whole-wheat flour minus 2 tablespoons.   1/2 cup white flour plus 1/2 cup whole-wheat flour.    Low-Fat Diet BREADS, CEREALS, PASTA, RICE, DRIED PEAS, AND BEANS These products are high in carbohydrates and most are low in fat. Therefore, they can be increased in the diet as substitutes for fatty foods. They too, however, contain calories  and should not be eaten in excess. Cereals can be eaten for snacks as well as for breakfast.  Include foods that contain fiber (fruits, vegetables, whole grains, and legumes). Research shows that fiber may lower blood cholesterol levels, especially the water-soluble fiber found in fruits, vegetables, oat products, and legumes. FRUITS AND VEGETABLES It is good to eat fruits and vegetables. Besides being sources of fiber, both are rich in vitamins and some minerals. They help you get the daily allowances of these nutrients. Fruits and vegetables can be used for snacks and desserts. MEATS Limit lean meat, chicken, [place], and fish to no more than 6 ounces per day. Beef, Pork, and Lamb Use lean cuts of beef, pork, and lamb. Lean cuts include:  Extra-lean ground beef.  Arm roast.  Sirloin tip.  Center-cut  ham.  Round steak.  Loin chops.  Rump roast.  Tenderloin.  Trim all fat off the outside of meats before cooking. It is not necessary to severely decrease the intake of red meat, but lean choices should be made. Lean meat is rich in protein and contains a highly absorbable form of iron. Premenopausal women, in particular, should avoid reducing lean red meat because this could increase the risk for low red blood cells (iron-deficiency anemia). The organ meats, such as liver, sweetbreads, kidneys, and brain are very rich in cholesterol. They should be limited. Chicken and [place] These are good sources of protein. The fat of poultry can be reduced by removing the skin and underlying fat layers before cooking. Chicken and [place] can be substituted for lean red meat in the diet. Poultry should not be fried or covered with high-fat sauces. Fish and Shellfish Fish is a good source of protein. Shellfish contain cholesterol, but they usually are low in saturated fatty acids. The preparation of fish is important. Like chicken and [place], they should not be fried or covered with high-fat sauces. EGGS Egg whites contain no fat or cholesterol. They can be eaten often. Try 1 to 2 egg whites instead of whole eggs in recipes or use egg substitutes that do not contain yolk. MILK AND DAIRY PRODUCTS Use skim or 1% milk instead of 2% or whole milk. Decrease whole milk, natural, and processed cheeses. Use nonfat or low-fat (2%) cottage cheese or low-fat cheeses made from vegetable oils. Choose nonfat or low-fat (1 to 2%) yogurt. Experiment with evaporated skim milk in recipes that call for heavy cream. Substitute low-fat yogurt or low-fat cottage cheese for sour cream in dips and salad dressings. Have at least 2 servings of low-fat dairy products, such as 2 glasses of skim (or 1%) milk each day to help get your daily calcium intake.  FATS AND OILS Reduce the total intake of fats, especially saturated fat. Butterfat,  lard, and beef fats are high in saturated fat and cholesterol. These should be avoided as much as possible. Vegetable fats do not contain cholesterol, but certain vegetable fats, such as coconut oil, palm oil, and palm kernel oil are very high in saturated fats. These should be limited. These fats are often used in bakery goods, processed foods, popcorn, oils, and nondairy creamers. Vegetable shortenings and some peanut butters contain hydrogenated oils, which are also saturated fats. Read the labels on these foods and check for saturated vegetable oils. Unsaturated vegetable oils and fats do not raise blood cholesterol. However, they should be limited because they are fats and are high in calories. Total fat should still be limited to 30% of your daily caloric  intake. Desirable liquid vegetable oils are corn oil, cottonseed oil, olive oil, canola oil, safflower oil, soybean oil, and sunflower oil. Peanut oil is not as good, but small amounts are acceptable. Buy a heart-healthy tub margarine that has no partially hydrogenated oils in the ingredients. Mayonnaise and salad dressings often are made from unsaturated fats, but they should also be limited because of their high calorie and fat content. Seeds, nuts, peanut butter, olives, and avocados are high in fat, but the fat is mainly the unsaturated type. These foods should be limited mainly to avoid excess calories and fat. OTHER EATING TIPS Snacks  Most sweets should be limited as snacks. They tend to be rich in calories and fats, and their caloric content outweighs their nutritional value. Some good choices in snacks are graham crackers, melba toast, soda crackers, bagels (no egg), English muffins, fruits, and vegetables. These snacks are preferable to snack crackers, [place] fries, and chips. Popcorn should be air-popped or cooked in small amounts of liquid vegetable oil. Desserts Eat fruit, low-fat yogurt, and fruit ices. AVOID pastries, cake, and cookies.  Sherbet, angel food cake, gelatin dessert, frozen low-fat yogurt, or other frozen products that do not contain saturated fat (pure fruit juice bars, frozen ice pops) are also acceptable.  COOKING METHODS Choose those methods that use little or no fat. They include: Poaching.  Braising.  Steaming.  Grilling.  Baking.  Stir-frying.  Broiling.  Microwaving.  Foods can be cooked in a nonstick pan without added fat, or use a nonfat cooking spray in regular cookware. Limit fried foods and avoid frying in saturated fat. Add moisture to lean meats by using water, broth, cooking wines, and other nonfat or low-fat sauces along with the cooking methods mentioned above. Soups and stews should be chilled after cooking. The fat that forms on top after a few hours in the refrigerator should be skimmed off. When preparing meals, avoid using excess salt. Salt can contribute to raising blood pressure in some people. EATING AWAY FROM HOME Order entres, potatoes, and vegetables without sauces or butter. When meat exceeds the size of a deck of cards (3 to 4 ounces), the rest can be taken home for another meal. Choose vegetable or fruit salads and ask for low-calorie salad dressings to be served on the side. Use dressings sparingly. Limit high-fat toppings, such as bacon, crumbled eggs, cheese, sunflower seeds, and olives. Ask for heart-healthy tub margarine instead of butter.  Hemorrhoids Hemorrhoids are dilated (enlarged) veins around the rectum. Sometimes clots will form in the veins. This makes them swollen and painful. These are called thrombosed hemorrhoids. Causes of hemorrhoids include:  Constipation.   Straining to have a bowel movement.   HEAVY LIFTING HOME CARE INSTRUCTIONS  Eat a well balanced diet and drink 6 to 8 glasses of water every day to avoid constipation. You may also use a bulk laxative.   Avoid straining to have bowel movements.   Keep anal area dry and clean.   Do not use a donut  shaped pillow or sit on the toilet for long periods. This increases blood pooling and pain.   Move your bowels when your body has the urge; this will require less straining and will decrease pain and pressure.

## 2014-10-28 NOTE — Anesthesia Postprocedure Evaluation (Signed)
  Anesthesia Post-op Note  Patient: Kelli Garcia  Procedure(s) Performed: Procedure(s) with comments: COLONOSCOPY WITH PROPOFOL (N/A) - In cecum @ 0908, out @ 0924 ESOPHAGOGASTRODUODENOSCOPY (EGD) WITH PROPOFOL (N/A) BIOPSY (N/A)  Patient Location: PACU  Anesthesia Type:MAC  Level of Consciousness: awake, alert  and oriented  Airway and Oxygen Therapy: Patient Spontanous Breathing  Post-op Pain: none  Post-op Assessment: Post-op Vital signs reviewed, Patient's Cardiovascular Status Stable, Respiratory Function Stable, Patent Airway and No signs of Nausea or vomiting  Post-op Vital Signs: Reviewed and stable  Last Vitals:  Filed Vitals:   10/28/14 0830  BP: 102/72  Pulse:   Temp:   Resp: 14    Complications: No apparent anesthesia complications

## 2014-10-28 NOTE — Telephone Encounter (Signed)
PT is aware. Said she has had diarrhea x 5 since she got home, she has a little Lomotil on hand and she might just take a little dose. I told her if she did, not to take much.

## 2014-10-28 NOTE — H&P (Signed)
Primary Care Physician:  Aggie Cosier, MD Primary Gastroenterologist:  Dr. Darrick Penna  Pre-Procedure History & Physical: HPI:  Kelli Garcia is a 49 y.o. female here for Anemia/HEME POS STOOLS  Past Medical History  Diagnosis Date  . Clostridium difficile colitis   . HIV (human immunodeficiency virus infection)   . Diarrhea   . Anxiety   . Hypokalemia   . Thrush   . Helicobacter pylori gastritis   . Barrett's esophagus   . Disc disorder   . Migraine headache   . Postoperative edema   . Iron deficiency anemia   . History of blood transfusion     Past Surgical History  Procedure Laterality Date  . 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 Esophagogastroduodenoscopy endoscopy  02/03/2009    Duodenitis with hyperplasia and dilation of brunner's glands, H. Pylori gastritis, Barrett's esophagus  . Esophagogastroduodenoscopy  2012    At [facility]: ok (per patient)  . Endarterectomy  05/11/2012    benign duodenal mucosa, increased eosinophils in lamina propria; Esophagus benign squamous mucosa with increased eosinophils and fucal exudate  . Flexible sigmoidoscopy  05/11/2012    Benign colonic mucosa with suggestion of erosive features  . Ercp  2010    stents placed in bile duct after lap chole by Dr. Karilyn Cota at [facility]  . Cholecystectomy  [facility]  . Liver biopsy      Prior to Admission medications   Medication Sig Start Date End Date Taking? Authorizing Provider  ALPRAZolam Prudy Feeler) 1 MG tablet Take 1 mg by mouth 3 (three) times daily as needed for anxiety.   Yes Historical Provider, MD  cyclobenzaprine (FLEXERIL) 10 MG tablet Take 10 mg by mouth 3 (three) times daily as needed for muscle spasms.   Yes Historical Provider, MD  Darunavir Ethanolate (PREZISTA) 800 MG tablet Take 800 mg by mouth daily.   Yes Historical Provider, MD  diphenoxylate-atropine (LOMOTIL) 2.5-0.025 MG per tablet Take by mouth 4 (four) times daily as needed for  diarrhea or loose stools.   Yes Historical Provider, MD  Dolutegravir Sodium (TIVICAY PO) Take 50 mg by mouth 2 (two) times daily.    Yes Historical Provider, MD  Doxepin HCl 3 MG TABS Take by mouth. One tablet nightly   Yes Historical Provider, MD  emtricitabine (EMTRIVA) 200 MG capsule Take 200 mg by mouth daily.   Yes Historical Provider, MD  furosemide (LASIX) 20 MG tablet Take 20 mg by mouth. As needed   Yes Historical Provider, MD  hydrOXYzine (ATARAX/VISTARIL) 25 MG tablet Take 25 mg by mouth 3 (three) times daily as needed. Takes one daily   Yes Historical Provider, MD  ibuprofen (ADVIL,MOTRIN) 800 MG tablet Take 800 mg by mouth every 8 (eight) hours as needed.   Yes Historical Provider, MD  NON Selina Cooley   One tablet daily for sinus   Yes Historical Provider, MD  ondansetron (ZOFRAN) 4 MG tablet Take 4 mg by mouth every 8 (eight) hours as needed for nausea or vomiting.   Yes Historical Provider, MD  polyethylene glycol-electrolytes (NULYTELY/GOLYTELY) 420 G solution Take 4,000 mLs by mouth once. 10/07/14  Yes Anice Paganini, NP  ritonavir (NORVIR) 100 MG capsule Take by mouth daily with breakfast.   Yes Historical Provider, MD  SUMAtriptan (IMITREX) 25 MG tablet Take 25 mg by mouth every 2 (two) hours as needed for migraine. May repeat in 2 hours if headache persists or recurs.  Yes Historical Provider, MD  potassium chloride (K-DUR) 10 MEQ tablet Take 10 mEq by mouth daily.    Historical Provider, MD    Allergies as of 10/07/2014 - never reviewed  Allergen Reaction Noted  . Abacavir Other (See Comments) 10/07/2014  . Betadine [povidone iodine] Itching 10/07/2014  . Viread [tenofovir disoproxil fumarate] Other (See Comments) 10/07/2014    Family History  Problem Relation Age of Onset  . Colon cancer Neg Hx   . Heart disease Mother   . Lung cancer Father   . Mesothelioma Father     History   Social History  . Marital Status: Divorced    Spouse Name: N/A  . Number of  Children: N/A  . Years of Education: N/A   Occupational History  . Not on file.   Social History Main Topics  . Smoking status: Former Smoker -- 0.25 packs/day for 4 years    Types: Cigarettes    Quit date: 10/21/2013  . Smokeless tobacco: Never Used     Comment: smoked about 4 cigarettes for 4 years-pt has quit  . Alcohol Use: No  . Drug Use: No  . Sexual Activity: Not on file   Other Topics Concern  . Not on file   Social History Narrative    Review of Systems: See HPI, otherwise negative ROS   Physical Exam: BP 102/72 mmHg  Pulse 82  Temp(Src) 98.1 F (36.7 C) (Oral)  Resp 14  SpO2 100% General:   Alert,  pleasant and cooperative in NAD Head:  Normocephalic and atraumatic. Neck:  Supple; Lungs:  Clear throughout to auscultation.    Heart:  Regular rate and rhythm. Abdomen:  Soft, nontender and nondistended. Normal bowel sounds, without guarding, and without rebound.   Neurologic:  Alert and  oriented x4;  grossly normal neurologically.  Impression/Plan:   Anemia/HEME POS STOOLS  PLAN:  1. TCS/EGD TODAY

## 2014-10-28 NOTE — Telephone Encounter (Signed)
Pt called and said she has had some rectal bleed ( bright red blood in comode) x 3 since procedures this AM. She is not having any pain, no dizziness, no fever. Said she feels fine.  Please advise!  Sending Dr. Darrick PennaFields a pager.

## 2014-10-28 NOTE — Anesthesia Procedure Notes (Signed)
Procedure Name: MAC Date/Time: 10/28/2014 8:41 AM Performed by: Pernell DupreADAMS, AMY A Pre-anesthesia Checklist: Patient identified, Timeout performed, Emergency Drugs available, Suction available and Patient being monitored Patient Re-evaluated:Patient Re-evaluated prior to inductionOxygen Delivery Method: Simple face mask

## 2014-10-28 NOTE — Op Note (Signed)
[facility] [address] [place], [id number]   ENDOSCOPY PROCEDURE REPORT  PATIENT: Kelli AbideRhew, Kelli B  MR#: [id number] BIRTHDATE: 1965-08-13 , 49  yrs. old GENDER: female  ENDOSCOPIST: West BaliSandi L Rodderick Holtzer, MD REFERRED BY:  PROCEDURE DATE: 10/28/2014 PROCEDURE:   EGD w/ biopsy  INDICATIONS:anemia. PT HAD MULTIPLE TCS/EGD IN PAST.LAST EVAL 2012-GIVENS CAPSULE x1. MEDICATIONS: Monitored anesthesia care TOPICAL ANESTHETIC:   Viscous Xylocaine ASA CLASS:  DESCRIPTION OF PROCEDURE:     Physical exam was performed.  Informed consent was obtained from the patient after explaining the benefits, risks, and alternatives to the procedure.  The patient was connected to the monitor and placed in the left lateral position.  Continuous oxygen was provided by nasal cannula and IV medicine administered through an indwelling cannula.  After administration of sedation, the patients esophagus was intubated and the     endoscope was advanced under direct visualization to the second portion of the duodenum.  The scope was removed slowly by carefully examining the color, texture, anatomy, and integrity of the mucosa on the way out.  The patient was recovered in endoscopy and discharged home in satisfactory condition.   ESOPHAGUS: The mucosa of the esophagus appeared normal.   sTOMACH: Moderate erosive gastritis (inflammation) was found in the gastric antrum.  There was adherent blood present SEEN IN TEH ANTRUM PRIOR TO INTUBATING THE PYLORUS.  Multiple biopsies were performed using cold forceps.   Multiple small non-bleeding, deep, irregular shaped and clean-based ulcers were found in the gastric antrum.  Biopsies were taken around the ulcers. DUODENUM: Moderate duodenal inflammation was found in the duodenal bulb.   The duodenal mucosa showed no abnormalities in the 2nd part of the duodenum.  Cold forceps biopsies were taken in the bulb and second portion. COMPLICATIONS: There were  no immediate complications.  ENDOSCOPIC IMPRESSION: 1.   ANEMIA MAY BE DUE TO PUD/GASTRITIS//DUODENITIS DUE TO NSAID USE W/O A PPI 2.   MODERATE Erosive gastritis AND DUODENITIS  RECOMMENDATIONS: PROTONIX BID. DISCUSSED WITH BENNY. NO ASPIRIN, BC/GOODY POWDERS, IBUPROFEN/MOTRIN, OR NAPROXEN/ALEVE UNTIL JUN 28. CHECK IRON STORES TODAY. AVOID ITEMS THAT TRIGGER GASTRITIS & ULCERS. FOLLOW A LOW FAT/HIGH FIBER DIET. AWAIT BIOPSY RESULTS. FOLLOW UP IN 3 MOS.  REPEAT EXAM: eSigned:  West BaliSandi L Valery Amedee, MD 10/28/2014 10:23 AM  CPT CODES: ICD CODES:  The ICD and CPT codes recommended by this software are interpretations from the data that the clinical staff has captured with the software.  The verification of the translation of this report to the ICD and CPT codes and modifiers is the sole responsibility of the health care institution and practicing physician where this report was generated.  [employer] will not be held responsible for the validity of the ICD and CPT codes included on this report.  AMA assumes no liability for data contained or not contained herein. CPT is a Publishing rights managerregistered trademark of the [employer].

## 2014-10-28 NOTE — Op Note (Signed)
[facility] [address] [place], [id number]   COLONOSCOPY PROCEDURE REPORT  PATIENT: Kelli Garcia, Kelli B  MR#: [id number] BIRTHDATE: 15-May-1966 , 49  yrs. old GENDER: female ENDOSCOPIST: West BaliSandi L Zidan Helget, MD REFERRED BY: PROCEDURE DATE:  10/28/2014 PROCEDURE:   Colonoscopy with biopsy INDICATIONS:anemia, non-specific. MEDICATIONS: Monitored anesthesia care  DESCRIPTION OF PROCEDURE:    Physical exam was performed.  Informed consent was obtained from the patient after explaining the benefits, risks, and alternatives to procedure.  The patient was connected to monitor and placed in left lateral position. Continuous oxygen was provided by nasal cannula and IV medicine administered through an indwelling cannula.  After administration of sedation and rectal exam, the patients rectum was intubated and the     colonoscope was advanced under direct visualization to the ileum.  The scope was removed slowly by carefully examining the color, texture, anatomy, and integrity mucosa on the way out.  The patient was recovered in endoscopy and discharged home in satisfactory condition.    COLON FINDINGS: The examined terminal ileum appeared to be normal. , The colon was redundant.  Manual abdominal counter-pressure was used to reach the cecum, OTHERWISE NORMAL COLON MUCOSA, and PROMINENT HEMORRHOID PLEXUS WITH POSSIBLE OVERLYING VERRUCOUS CHANGES.  COLD BIOPSIES OBTAINED.  PREP QUALITY: excellent.  CECAL W/D TIME: 16       minutes COMPLICATIONS: None  ENDOSCOPIC IMPRESSION: 1.   NO OBVIOUS REASON FOR PROFOUND ANEMIA IDENTIFIED. 2.   The LEFT colon IS redundant 3.   LARGE INTERNAL HEMORRHOIDS WITH POSSIBLE ANAL CONDYLOMA  RECOMMENDATIONS: WILL DISCUSS APPROPRIATE PPI WITH PHARMACY IN LIGHT OF ANTIRETROVIRAL TEHRAPY. NO ASPIRIN, BC/GOODY POWDERS, IBUPROFEN/MOTRIN, OR NAPROXEN/ALEVE UNTIL JUN 28. CHECK IRON STORES TODAY. AVOID ITEMS THAT TRIGGER GASTRITIS & ULCERS. FOLLOW  A LOW FAT/HIGH FIBER DIET. AWAIT BIOPSY RESULTS. FOLLOW UP IN 3 MOS.    _______________________________ eSignedWest Bali:  Yeudiel Mateo L Azion Centrella, MD 10/28/2014 10:17 AM   CPT CODES: ICD CODES:  The ICD and CPT codes recommended by this software are interpretations from the data that the clinical staff has captured with the software.  The verification of the translation of this report to the ICD and CPT codes and modifiers is the sole responsibility of the health care institution and practicing physician where this report was generated.  [employer] will not be held responsible for the validity of the ICD and CPT codes included on this report.  AMA assumes no liability for data contained or not contained herein. CPT is a Publishing rights managerregistered trademark of the [employer].

## 2014-10-28 NOTE — Anesthesia Preprocedure Evaluation (Signed)
Anesthesia Evaluation  Patient identified by MRN, date of birth, ID band Patient awake    Reviewed: Allergy & Precautions, NPO status , Patient's Chart, lab work & pertinent test results  Airway Mallampati: I  TM Distance: >3 FB     Dental  (+) Edentulous Upper, Poor Dentition, Chipped, Loose, Missing, Dental Advisory Given,    Pulmonary former smoker,  breath sounds clear to auscultation        Cardiovascular negative cardio ROS  Rhythm:Regular Rate:Normal     Neuro/Psych  Headaches, PSYCHIATRIC DISORDERS Anxiety    GI/Hepatic GERD-  Controlled and Medicated,  Endo/Other    Renal/GU      Musculoskeletal   Abdominal   Peds  Hematology  (+) anemia , HIV,   Anesthesia Other Findings   Reproductive/Obstetrics                             Anesthesia Physical Anesthesia Plan  ASA: III  Anesthesia Plan: MAC   Post-op Pain Management:    Induction: Intravenous  Airway Management Planned: Simple Face Mask  Additional Equipment:   Intra-op Plan:   Post-operative Plan:   Informed Consent: I have reviewed the patients History and Physical, chart, labs and discussed the procedure including the risks, benefits and alternatives for the proposed anesthesia with the patient or authorized representative who has indicated his/her understanding and acceptance.     Plan Discussed with:   Anesthesia Plan Comments:         Anesthesia Quick Evaluation

## 2014-10-29 ENCOUNTER — Encounter (HOSPITAL_COMMUNITY): Payer: Self-pay | Admitting: Gastroenterology

## 2014-10-29 NOTE — Telephone Encounter (Signed)
REVIEWED. AGREE. NO ADDITIONAL RECOMMENDATIONS. 

## 2014-10-29 NOTE — Progress Notes (Signed)
CC'ED TO PCP 

## 2014-11-10 NOTE — Telephone Encounter (Signed)
YOUR BIOPSIES SHOWS ULCERS, gastritis & DUODENITIS DUE TO IBUPROFEN USE. YOUR IRON STORES ARE LOW DUE TO BLOOD LOSS FROM ULCERS, GASTRITIS, AND DUODENITIS.   FOLLOW A LOW FAT/HIGH FIBER DIET. AVOID ITEMS THAT CAUSE BLOATING. .  TAKE NEXIUM 30 MINUTES PRIOR TO MEALS TWICE DAILY.  NO ASPIRIN, BC/GOODY POWDERS, IBUPROFEN/MOTRIN, OR NAPROXEN/ALEVE UNTIL JUL 12.  AVOID ITEMS THAT TRIGGER GASTRITIS & ULCERS.   FOLLOW UP IN 3 MOS E30 ANEMIA, ULCERS. YOU WILL NEED A REPEAT CBC/FERRITIN/UPPER ENDOSCOPY/POSSIBLE REPEAT GIVENS STUDY AFTER YOUR NEXT VISIT. 

## 2014-11-10 NOTE — Telephone Encounter (Signed)
YOUR BIOPSIES SHOWS ULCERS, gastritis & DUODENITIS DUE TO IBUPROFEN USE. YOUR IRON STORES ARE LOW DUE TO BLOOD LOSS FROM ULCERS, GASTRITIS, AND DUODENITIS.   FOLLOW A LOW FAT/HIGH FIBER DIET. AVOID ITEMS THAT CAUSE BLOATING. .  TAKE NEXIUM 30 MINUTES PRIOR TO MEALS TWICE DAILY.  NO ASPIRIN, BC/GOODY POWDERS, IBUPROFEN/MOTRIN, OR NAPROXEN/ALEVE UNTIL JUL 12.  AVOID ITEMS THAT TRIGGER GASTRITIS & ULCERS.   FOLLOW UP IN 3 MOS E30 ANEMIA, ULCERS. YOU WILL NEED A REPEAT CBC/FERRITIN/UPPER ENDOSCOPY/POSSIBLE REPEAT GIVENS STUDY AFTER YOUR NEXT VISIT.

## 2014-11-17 ENCOUNTER — Telehealth: Payer: Self-pay

## 2014-11-17 ENCOUNTER — Telehealth: Payer: Self-pay | Admitting: Gastroenterology

## 2014-11-17 NOTE — Telephone Encounter (Signed)
PLEASE CALL PT. HER BIOPSIES PF HER STOMACH SHOWS SHE DOES NOT HAVE H PYLORI INFECTION. OTHER MEDICATION MAY CAUSE GASTRITIS.

## 2014-11-17 NOTE — Telephone Encounter (Signed)
I have faxed procedure and path report to [facility] Hematology (Dr Nelwyn Salisbury) and to [facility] Alamin per patient's request.

## 2014-11-17 NOTE — Telephone Encounter (Signed)
Pt called and said she viewed her results in her MY Chart. She did not understand why her gastritis is due to Ibuprofen when she has only taken about one tablet in the last 4 years.  Also, she said she has a hx of H Pylori and would like to be checked for that.  Please advise!

## 2014-11-17 NOTE — Telephone Encounter (Signed)
Pt is aware.  

## 2015-01-07 ENCOUNTER — Ambulatory Visit (INDEPENDENT_AMBULATORY_CARE_PROVIDER_SITE_OTHER): Payer: Medicare Other | Admitting: Nurse Practitioner

## 2015-01-07 ENCOUNTER — Encounter: Payer: Self-pay | Admitting: Nurse Practitioner

## 2015-01-07 ENCOUNTER — Other Ambulatory Visit: Payer: Self-pay

## 2015-01-07 VITALS — BP 103/69 | HR 93 | Temp 97.0°F | Ht 65.0 in | Wt 133.6 lb

## 2015-01-07 DIAGNOSIS — D649 Anemia, unspecified: Secondary | ICD-10-CM

## 2015-01-07 DIAGNOSIS — K279 Peptic ulcer, site unspecified, unspecified as acute or chronic, without hemorrhage or perforation: Secondary | ICD-10-CM | POA: Diagnosis not present

## 2015-01-07 NOTE — Patient Instructions (Signed)
1. Continue taking her Nexium. 2. We will schedule your procedure for you. 3. I am not going to order blood tests on you today because you're following up with hematology next week. 4. Return for follow-up in 3 months.

## 2015-01-07 NOTE — Progress Notes (Signed)
Referring Provider: Aggie Cosier, MD Primary Care Physician:  Aggie Cosier, MD Primary GI:  Dr. Darrick Penna  Chief Complaint  Patient presents with  . Follow-up    HPI:   49 year old female presents for follow-up on anemia and heme-positive stool. She recently had colonoscopy and endoscopy on 10/28/2014. Colonoscopy showed no obvious reason for profound anemia, left: Redundant, large internal hemorrhoids. Endoscopy showed moderate erosive gastritis in the gastric antrum, adherent blood present in the antrum, multiple small nonbleeding deep irregular shaped and clean based ulcers in the antrum, multiple biopsies were taken. Pathology found duodenitis and chronic active gastritis with pathology comment including differential diagnosis including NSAID injury just favored, peptic injury, or infection. Recommended Protonix twice a day after discussion with the pharmacist given antiretroviral therapy as well as avoidance of NSAIDs. Iron stores were checked the day of the procedure and were low.  Today she states she doesn't take any NSAIDs. She is taking her Nexium. She states she's feeling ok. She is going back to hematology next week for iron infusion. She also takes daily iron pills but question absorption issue with HIV medications. Denies abdominal pain, N/V, GERD symptoms, hematochezia, melena. Is having intermittent diarrhea, which she says is normal after taking iron. Has hemorrhoid symptoms with diarrhea but has Rx hemorrhoid cream refills. Denies chest pain, dyspnea, dizziness, lightheadedness, syncope, near syncope. Denies any other upper or lower GI symptoms.   Past Medical History  Diagnosis Date  . Clostridium difficile colitis   . HIV (human immunodeficiency virus infection)   . Diarrhea   . Anxiety   . Hypokalemia   . Thrush   . Helicobacter pylori gastritis   . Barrett's esophagus   . Disc disorder   . Migraine headache   . Postoperative edema   . Iron deficiency anemia   .  History of blood transfusion     Past Surgical History  Procedure Laterality Date  . 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 Esophagogastroduodenoscopy endoscopy  02/03/2009    Duodenitis with hyperplasia and dilation of brunner's glands, H. Pylori gastritis, Barrett's esophagus  . Esophagogastroduodenoscopy  2012    At [facility]: ok (per patient)  . Endarterectomy  05/11/2012    benign duodenal mucosa, increased eosinophils in lamina propria; Esophagus benign squamous mucosa with increased eosinophils and fucal exudate  . Flexible sigmoidoscopy  05/11/2012    Benign colonic mucosa with suggestion of erosive features  . Ercp  2010    stents placed in bile duct after lap chole by Dr. Karilyn Cota at [facility]  . Cholecystectomy  [facility]  . Liver biopsy    . Colonoscopy with propofol N/A 10/28/2014    ZOX:[id number] internal hemorrhoids  . Esophagogastroduodenoscopy (egd) with propofol N/A 10/28/2014    [id number] erosive gastritis and duodenitis  . Esophageal biopsy N/A 10/28/2014    Procedure: BIOPSY;  Surgeon: West Bali, MD;  Location: [facility];  Service: Endoscopy;  Laterality: N/A;    Current Outpatient Prescriptions  Medication Sig Dispense Refill  . ALPRAZolam (XANAX) 1 MG tablet Take 1 mg by mouth 3 (three) times daily as needed for anxiety.    . cyclobenzaprine (FLEXERIL) 10 MG tablet Take 10 mg by mouth 3 (three) times daily as needed for muscle spasms.    . Darunavir Ethanolate (PREZISTA) 800 MG tablet Take 800 mg by mouth daily.    . diphenoxylate-atropine (LOMOTIL) 2.5-0.025 MG per tablet Take by mouth 4 (four)  times daily as needed for diarrhea or loose stools.    . Dolutegravir Sodium (TIVICAY PO) Take 50 mg by mouth 2 (two) times daily.     . Doxepin HCl 3 MG TABS Take by mouth. One tablet nightly    . emtricitabine (EMTRIVA) 200 MG capsule Take 200 mg by mouth daily.    Marland Kitchen esomeprazole (NEXIUM) 40 MG capsule Take 1 capsule (40 mg  total) by mouth 2 (two) times daily before a meal. 60 capsule 11  . furosemide (LASIX) 20 MG tablet Take 20 mg by mouth. As needed    . hydrOXYzine (ATARAX/VISTARIL) 25 MG tablet Take 25 mg by mouth 3 (three) times daily as needed. Takes one daily    . NON FORMULARY XAlZ   One tablet daily for sinus    . ondansetron (ZOFRAN) 4 MG tablet Take 4 mg by mouth every 8 (eight) hours as needed for nausea or vomiting.    . potassium chloride (K-DUR) 10 MEQ tablet Take 10 mEq by mouth daily.    Marland Kitchen PROCTOZONE-HC 2.5 % rectal cream APPLY A THIN FILM TO AFFECTED AREA 2 TO 4 TIMES DAILY AS DIRECTED  2  . ritonavir (NORVIR) 100 MG capsule Take by mouth daily with breakfast.    . SUMAtriptan (IMITREX) 25 MG tablet Take 25 mg by mouth every 2 (two) hours as needed for migraine. May repeat in 2 hours if headache persists or recurs.     No current facility-administered medications for this visit.    Allergies as of 01/07/2015 - Review Complete 01/07/2015  Allergen Reaction Noted  . Abacavir Other (See Comments) 10/07/2014  . Betadine [povidone iodine] Itching 10/07/2014  . Viread [tenofovir disoproxil fumarate] Other (See Comments) 10/07/2014  . Clarithromycin Diarrhea 10/17/2014    Family History  Problem Relation Age of Onset  . Colon cancer Neg Hx   . Heart disease Mother   . Lung cancer Father   . Mesothelioma Father     Social History   Social History  . Marital Status: Divorced    Spouse Name: N/A  . Number of Children: N/A  . Years of Education: N/A   Social History Main Topics  . Smoking status: Former Smoker -- 0.25 packs/day for 4 years    Types: Cigarettes    Quit date: 10/21/2013  . Smokeless tobacco: Never Used     Comment: smoked about 4 cigarettes for 4 years-pt has quit  . Alcohol Use: No  . Drug Use: No  . Sexual Activity: Not Asked   Other Topics Concern  . None   Social History Narrative    Review of Systems: General: Negative for anorexia, weight loss, fever,  chills, fatigue, weakness. CV: Negative for chest pain, angina, palpitations, dyspnea on exertion, peripheral edema.  Respiratory: Negative for dyspnea at rest, dyspnea on exertion, cough, sputum, wheezing.  GI: See history of present illness. Endo: Negative for unusual weight change.  Heme: Negative for bruising or bleeding. Allergy: Negative for rash or hives.   Physical Exam: BP 103/69 mmHg  Pulse 93  Temp(Src) 97 F (36.1 C)  Ht  (1.651 m)  Wt 133 lb 9.6 oz (60.601 kg)  BMI 22.23 kg/m2 General:   Alert and oriented. Pleasant and cooperative. Well-nourished and well-developed.  Head:  Normocephalic and atraumatic. Eyes:  Without icterus, sclera clear and conjunctiva pink.  Ears:  Normal auditory acuity. Cardiovascular:  S1, S2 present without murmurs appreciated. Normal pulses noted. Extremities without clubbing or edema. Respiratory:Roselie Awkward Clear  to auscultation bilaterally. No wheezes, rales, or rhonchi. No distress.  Gastrointestinal:  +BS, soft, non-tender and non-distended. No HSM noted. No guarding or rebound. No masses appreciated.  Rectal:  Deferred  Neurologic:  Alert and oriented x4;  grossly normal neurologically. Psych:  Alert and cooperative. Normal mood and affect. Heme/Lymph/Immune: No excessive bruising noted.    01/07/2015 9:21 AM

## 2015-01-07 NOTE — Progress Notes (Signed)
CC'ED TO PCP 

## 2015-01-07 NOTE — Assessment & Plan Note (Signed)
Patient with clean base ulcers and adherent clot noted in her stomach at last endoscopy approximately 2-1/2 months ago. Likely explanation for intermittent anemia. She denies using NSAIDs. She was started on a PPI (Nexium daily) which she has been taking. Denies GERD symptoms, blood in stool, and other red flag/warning signs or symptoms. We'll plan to repeat her endoscopy 3 months post for surveillance of ulcer healing. Return for follow-up in 3 months.  Proceed with EGD with Dr. Darrick Penna in the OR with propofol in near future: the risks, benefits, and alternatives have been discussed with the patient in detail. The patient states understanding and desires to proceed.

## 2015-01-07 NOTE — Assessment & Plan Note (Signed)
Patient with intermittent anemia most likely explained by bleeding gastric ulcers and large hemorrhoids. Her iron stores completed today for procedure were quite low. She actively sees hematology and has some point within the next week and they will recheck her labs as well as a planned IV iron infusion. Therefore I will not check labs on her today. Return for follow-up in 3 months.

## 2015-01-14 ENCOUNTER — Telehealth: Payer: Self-pay

## 2015-01-14 NOTE — Telephone Encounter (Signed)
Pt is calling to see if we can help her with her hemorrhoids. She is want to have them banded or something. She does not want to keep having to deal with them. Please advise  Her call back [phone]

## 2015-01-14 NOTE — Telephone Encounter (Signed)
Please offer her an appointment with Dr. Darrick Penna (her primary GI) for possible office banding.

## 2015-01-15 ENCOUNTER — Encounter: Payer: Self-pay | Admitting: Gastroenterology

## 2015-01-15 NOTE — Telephone Encounter (Signed)
PATIENT SCHEDULED AND AWARE OF DATE AND TIME °

## 2015-01-15 NOTE — Telephone Encounter (Signed)
Please make her an appointment with [facility] for banding

## 2015-01-15 NOTE — Telephone Encounter (Signed)
REVIEWED-NO ADDITIONAL RECOMMENDATIONS. 

## 2015-01-28 ENCOUNTER — Encounter: Payer: Medicare Other | Admitting: Gastroenterology

## 2015-01-28 NOTE — Patient Instructions (Addendum)
LOIE JAHR  01/28/2015     @PREFPERIOPPHARMACY @   Your procedure is scheduled on 02/03/2015.  Report to [facility] at 9:30 [facility].M.  Call this number if you have problems the morning of surgery:  [phone]   Remember:  Do not eat food or drink liquids after midnight.  Take these medicines the morning of surgery with A SIP OF WATER Xanax, Flexeril, Prezista, Tivicay, Doxepin, Emtriva, Nexium, Novir, Imitrex   Do not wear jewelry, make-up or nail polish.  Do not wear lotions, powders, or perfumes.  You may wear deodorant.  Do not shave 48 hours prior to surgery.  Men may shave face and neck.  Do not bring valuables to the hospital.  [facility] is not responsible for any belongings or valuables.  Contacts, dentures or bridgework may not be worn into surgery.  Leave your suitcase in the car.  After surgery it may be brought to your room.  For patients admitted to the hospital, discharge time will be determined by your treatment team.  Patients discharged the day of surgery will not be allowed to drive home.    Please read over the following fact sheets that you were given. Anesthesia Post-op Instructions     PATIENT INSTRUCTIONS POST-ANESTHESIA  IMMEDIATELY FOLLOWING SURGERY:  Do not drive or operate machinery for the first twenty four hours after surgery.  Do not make any important decisions for twenty four hours after surgery or while taking narcotic pain medications or sedatives.  If you develop intractable nausea and vomiting or a severe headache please notify your doctor immediately.  FOLLOW-UP:  Please make an appointment with your surgeon as instructed. You do not need to follow up with anesthesia unless specifically instructed to do so.  WOUND CARE INSTRUCTIONS (if applicable):  Keep a dry clean dressing on the anesthesia/puncture wound site if there is drainage.  Once the wound has quit draining you may leave it open to air.  Generally you should leave the bandage  intact for twenty four hours unless there is drainage.  If the epidural site drains for more than 36-48 hours please call the anesthesia department.  QUESTIONS?:  Please feel free to call your physician or the hospital operator if you have any questions, and they will be happy to assist you.      Esophagogastroduodenoscopy Esophagogastroduodenoscopy (EGD) is a procedure to examine the lining of the esophagus, stomach, and first part of the small intestine (duodenum). A long, flexible, lighted tube with a camera attached (endoscope) is inserted down the throat to view these organs. This procedure is done to detect problems or abnormalities, such as inflammation, bleeding, ulcers, or growths, in order to treat them. The procedure lasts about 5-20 minutes. It is usually an outpatient procedure, but it may need to be performed in emergency cases in the hospital. LET YOUR CAREGIVER KNOW ABOUT:   Allergies to food or medicine.  All medicines you are taking, including vitamins, herbs, eyedrops, and over-the-counter medicines and creams.  Use of steroids (by mouth or creams).  Previous problems you or members of your family have had with the use of anesthetics.  Any blood disorders you have.  Previous surgeries you have had.  Other health problems you have.  Possibility of pregnancy, if this applies. RISKS AND COMPLICATIONS  Generally, EGD is a safe procedure. However, as with any procedure, complications can occur. Possible complications include:  Infection.  Bleeding.  Tearing (perforation) of the esophagus, stomach, or duodenum.  Difficulty  breathing or not being able to breath.  Excessive sweating.  Spasms of the larynx.  Slowed heartbeat.  Low blood pressure. BEFORE THE PROCEDURE  Do not eat or drink anything for 6-8 hours before the procedure or as directed by your caregiver.  Ask your caregiver about changing or stopping your regular medicines.  If you wear dentures, be  prepared to remove them before the procedure.  Arrange for someone to drive you home after the procedure. PROCEDURE   A vein will be accessed to give medicines and fluids. A medicine to relax you (sedative) and a pain reliever will be given through that access into the vein.  A numbing medicine (local anesthetic) may be sprayed on your throat for comfort and to stop you from gagging or coughing.  A mouth guard may be placed in your mouth to protect your teeth and to keep you from biting on the endoscope.  You will be asked to lie on your left side.  The endoscope is inserted down your throat and into the esophagus, stomach, and duodenum.  Air is put through the endoscope to allow your caregiver to view the lining of your esophagus clearly.  The esophagus, stomach, and duodenum is then examined. During the exam, your caregiver may:  Remove tissue to be examined under a microscope (biopsy) for inflammation, infection, or other medical problems.  Remove growths.  Remove objects (foreign bodies) that are stuck.  Treat any bleeding with medicines or other devices that stop tissues from bleeding (hot cautery, clipping devices).  Widen (dilate) or stretch narrowed areas of the esophagus and stomach.  The endoscope will then be withdrawn. AFTER THE PROCEDURE  You will be taken to a recovery area to be monitored. You will be able to go home once you are stable and alert.  Do not eat or drink anything until the local anesthetic and numbing medicines have worn off. You may choke.  It is normal to feel bloated, have pain with swallowing, or have a sore throat for a short time. This will wear off.  Your caregiver should be able to discuss his or her findings with you. It will take longer to discuss the test results if any biopsies were taken. Document Released: 09/09/2004 Document Revised: 09/23/2013 Document Reviewed: 04/11/2012 [facility] Patient Information 2015 [place], [place]. This  information is not intended to replace advice given to you by your health care provider. Make sure you discuss any questions you have with your health care provider.

## 2015-01-29 ENCOUNTER — Encounter (HOSPITAL_COMMUNITY)
Admission: RE | Admit: 2015-01-29 | Discharge: 2015-01-29 | Disposition: A | Discharge status: Home / Self Care | Payer: Medicare Other | Source: Ambulatory Visit | Attending: Gastroenterology | Admitting: Gastroenterology

## 2015-01-29 ENCOUNTER — Encounter (HOSPITAL_COMMUNITY): Payer: Self-pay

## 2015-01-29 DIAGNOSIS — K2970 Gastritis, unspecified, without bleeding: Secondary | ICD-10-CM | POA: Insufficient documentation

## 2015-01-29 DIAGNOSIS — Z01812 Encounter for preprocedural laboratory examination: Secondary | ICD-10-CM | POA: Diagnosis not present

## 2015-01-29 LAB — BASIC METABOLIC PANEL
Anion gap: 5 (ref 5–15)
BUN: 12 mg/dL (ref 6–20)
CHLORIDE: 110 mmol/L (ref 101–111)
CO2: 22 mmol/L (ref 22–32)
Calcium: 8.5 mg/dL — ABNORMAL LOW (ref 8.9–10.3)
Creatinine, Ser: 1 mg/dL (ref 0.44–1.00)
GFR calc non Af Amer: >60 mL/min (ref >60)
Glucose, Bld: 107 mg/dL — ABNORMAL HIGH (ref 65–99)
Potassium: 4.3 mmol/L (ref 3.5–5.1)
SODIUM: 137 mmol/L (ref 135–145)

## 2015-01-29 LAB — CBC
HCT: 34.3 % — ABNORMAL LOW (ref 36.0–46.0)
HEMOGLOBIN: 11 g/dL — AB (ref 12.0–15.0)
MCH: 30.8 pg (ref 26.0–34.0)
MCHC: 32.1 g/dL (ref 30.0–36.0)
MCV: 96.1 fL (ref 78.0–100.0)
Platelets: 142 10*3/uL — ABNORMAL LOW (ref 150–400)
RBC: 3.57 MIL/uL — AB (ref 3.87–5.11)
RDW: 16.7 % — ABNORMAL HIGH (ref 11.5–15.5)
WBC: 4 10*3/uL (ref 4.0–10.5)

## 2015-02-03 ENCOUNTER — Encounter (HOSPITAL_COMMUNITY): Payer: Self-pay | Admitting: *Deleted

## 2015-02-03 ENCOUNTER — Ambulatory Visit (HOSPITAL_COMMUNITY): Payer: Medicare Other | Admitting: Anesthesiology

## 2015-02-03 ENCOUNTER — Encounter (HOSPITAL_COMMUNITY)
Admission: RE | Disposition: A | Discharge status: Home / Self Care | Payer: Self-pay | Source: Ambulatory Visit | Attending: Gastroenterology

## 2015-02-03 ENCOUNTER — Ambulatory Visit (HOSPITAL_COMMUNITY)
Admission: RE | Admit: 2015-02-03 | Discharge: 2015-02-03 | Disposition: A | Discharge status: Home / Self Care | Payer: Medicare Other | Source: Ambulatory Visit | Attending: Gastroenterology | Admitting: Gastroenterology

## 2015-02-03 DIAGNOSIS — K2970 Gastritis, unspecified, without bleeding: Secondary | ICD-10-CM

## 2015-02-03 DIAGNOSIS — B20 Human immunodeficiency virus [HIV] disease: Secondary | ICD-10-CM | POA: Insufficient documentation

## 2015-02-03 DIAGNOSIS — Z87891 Personal history of nicotine dependence: Secondary | ICD-10-CM | POA: Diagnosis not present

## 2015-02-03 DIAGNOSIS — Z79899 Other long term (current) drug therapy: Secondary | ICD-10-CM | POA: Insufficient documentation

## 2015-02-03 DIAGNOSIS — F419 Anxiety disorder, unspecified: Secondary | ICD-10-CM | POA: Insufficient documentation

## 2015-02-03 DIAGNOSIS — K259 Gastric ulcer, unspecified as acute or chronic, without hemorrhage or perforation: Secondary | ICD-10-CM | POA: Insufficient documentation

## 2015-02-03 HISTORY — PX: ESOPHAGOGASTRODUODENOSCOPY (EGD) WITH PROPOFOL: SHX5813

## 2015-02-03 SURGERY — ESOPHAGOGASTRODUODENOSCOPY (EGD) WITH PROPOFOL
Anesthesia: Monitor Anesthesia Care

## 2015-02-03 MED ORDER — MIDAZOLAM HCL 2 MG/2ML IJ SOLN
1.0000 mg | INTRAMUSCULAR | Status: DC | PRN
  Administered 2015-02-03 (×2): 2 mg via INTRAVENOUS

## 2015-02-03 MED ORDER — LIDOCAINE VISCOUS 2 % MT SOLN: 15.0000 mL | Freq: Once | OROMUCOSAL | Status: DC

## 2015-02-03 MED ORDER — LACTATED RINGERS IV SOLN
INTRAVENOUS | Status: DC
  Administered 2015-02-03: 11:00:00 via INTRAVENOUS

## 2015-02-03 MED ORDER — STERILE WATER FOR IRRIGATION IR SOLN
Status: DC | PRN
  Administered 2015-02-03: 1000 mL

## 2015-02-03 MED ORDER — PROPOFOL 10 MG/ML IV BOLUS
INTRAVENOUS | Status: DC | PRN
  Administered 2015-02-03 (×2): 10 mg via INTRAVENOUS

## 2015-02-03 MED ORDER — FENTANYL CITRATE (PF) 100 MCG/2ML IJ SOLN: 25.0000 ug | INTRAMUSCULAR | Status: DC | PRN

## 2015-02-03 MED ORDER — MIDAZOLAM HCL 2 MG/2ML IJ SOLN
INTRAMUSCULAR | Status: AC
  Filled 2015-02-03: qty 2

## 2015-02-03 MED ORDER — LIDOCAINE VISCOUS 2 % MT SOLN
3.0000 mL | Freq: Once | OROMUCOSAL | Status: AC
  Administered 2015-02-03: 3 mL via OROMUCOSAL

## 2015-02-03 MED ORDER — FENTANYL CITRATE (PF) 100 MCG/2ML IJ SOLN
INTRAMUSCULAR | Status: AC
  Filled 2015-02-03: qty 2

## 2015-02-03 MED ORDER — ONDANSETRON HCL 4 MG/2ML IJ SOLN: 4.0000 mg | Freq: Once | INTRAMUSCULAR | Status: DC | PRN

## 2015-02-03 MED ORDER — FENTANYL CITRATE (PF) 100 MCG/2ML IJ SOLN
25.0000 ug | INTRAMUSCULAR | Status: AC
  Administered 2015-02-03 (×2): 25 ug via INTRAVENOUS

## 2015-02-03 MED ORDER — LIDOCAINE VISCOUS 2 % MT SOLN
OROMUCOSAL | Status: AC
Start: 2015-02-03 — End: 2015-02-03
  Filled 2015-02-03: qty 15

## 2015-02-03 MED ORDER — PROPOFOL INFUSION 10 MG/ML OPTIME
INTRAVENOUS | Status: DC | PRN
  Administered 2015-02-03: 150 ug/kg/min via INTRAVENOUS

## 2015-02-03 SURGICAL SUPPLY — 17 items
BLOCK BITE 60FR ADLT L/F BLUE (MISCELLANEOUS) ×3 IMPLANT
ELECT REM PT RETURN 9FT ADLT (ELECTROSURGICAL)
ELECTRODE REM PT RTRN 9FT ADLT (ELECTROSURGICAL) IMPLANT
FLOOR PAD 36X40 (MISCELLANEOUS) ×3
FORCEPS BIOP RAD 4 LRG CAP 4 (CUTTING FORCEPS) IMPLANT
FORMALIN 10 PREFIL 20ML (MISCELLANEOUS) IMPLANT
KIT ENDO PROCEDURE PEN (KITS) ×3 IMPLANT
MANIFOLD NEPTUNE II (INSTRUMENTS) ×3 IMPLANT
NEEDLE SCLEROTHERAPY 25GX240 (NEEDLE) IMPLANT
PAD FLOOR 36X40 (MISCELLANEOUS) ×1 IMPLANT
PROBE APC STR FIRE (PROBE) IMPLANT
PROBE INJECTION GOLD (MISCELLANEOUS)
PROBE INJECTION GOLD 7FR (MISCELLANEOUS) IMPLANT
SNARE SHORT THROW 13M SML OVAL (MISCELLANEOUS) IMPLANT
SYR INFLATION 60ML (SYRINGE) IMPLANT
TUBING IRRIGATION ENDOGATOR (MISCELLANEOUS) ×3 IMPLANT
WATER STERILE IRR 1000ML POUR (IV SOLUTION) ×3 IMPLANT

## 2015-02-03 NOTE — Discharge Instructions (Signed)
You have MILD gastritis. YOUR ULCERS HAVE HEALED. YOU HAD A LIMITED EXAM OF THE UPPER STOMACH DUE TO RETAINED FOOD.   FOLLOW A LOW FAT DIET. MEATS SHOULD BE BAKED, BROILED, OR BOILED. AVOID FRIED FOODS. SEE INFO BELOW.  AVOID TRIGGERS FOR GASTRITIS. SEE INFO BELOW.  TAKE NEXIUM 30 MINUTES PRIOR TO MEALS TWICE DAILY FOR ONE MORE MONTH THEN ONCE DAILY FOREVER.   FOLLOW UP IN 4 MOS.   UPPER ENDOSCOPY AFTER CARE Read the instructions outlined below and refer to this sheet in the next week. These discharge instructions provide you with general information on caring for yourself after you leave the hospital. While your treatment has been planned according to the most current medical practices available, unavoidable complications occasionally occur. If you have any problems or questions after discharge, call DR. Jiali Linney, [phone].  ACTIVITY  You may resume your regular activity, but move at a slower pace for the next 24 hours.   Take frequent rest periods for the next 24 hours.   Walking will help get rid of the air and reduce the bloated feeling in your belly (abdomen).   No driving for 24 hours (because of the medicine (anesthesia) used during the test).   You may shower.   Do not sign any important legal documents or operate any machinery for 24 hours (because of the anesthesia used during the test).    NUTRITION  Drink plenty of fluids.   You may resume your normal diet as instructed by your doctor.   Begin with a light meal and progress to your normal diet. Heavy or fried foods are harder to digest and may make you feel sick to your stomach (nauseated).   Avoid alcoholic beverages for 24 hours or as instructed.    MEDICATIONS  You may resume your normal medications.   WHAT YOU CAN EXPECT TODAY  Some feelings of bloating in the abdomen.   Passage of more gas than usual.    IF YOU HAD A BIOPSY TAKEN DURING THE UPPER ENDOSCOPY:  Eat a soft diet IF YOU HAVE  NAUSEA, BLOATING, ABDOMINAL PAIN, OR VOMITING.    FINDING OUT THE RESULTS OF YOUR TEST Not all test results are available during your visit. DR. Darrick Penna WILL CALL YOU WITHIN 7 DAYS OF YOUR PROCEDUE WITH YOUR RESULTS. Do not assume everything is normal if you have not heard from DR. Adaira Centola IN ONE WEEK, CALL HER OFFICE AT [phone].  SEEK IMMEDIATE MEDICAL ATTENTION AND CALL THE OFFICE: [phone] IF:  You have more than a spotting of blood in your stool.   Your belly is swollen (abdominal distention).   You are nauseated or vomiting.   You have a temperature over 101F.   You have abdominal pain or discomfort that is severe or gets worse throughout the day.   Gastritis  Gastritis is an inflammation (the body's way of reacting to injury and/or infection) of the stomach. It is often caused by viral or bacterial (germ) infections. It can also be caused BY ASPIRIN, BC/GOODY POWDER'S, (IBUPROFEN) MOTRIN, OR ALEVE (NAPROXEN), chemicals (including alcohol), SPICY FOODS, and medications. This illness may be associated with generalized malaise (feeling tired, not well), UPPER ABDOMINAL STOMACH cramps, and fever. One common bacterial cause of gastritis is an organism known as H. Pylori. This can be treated with antibiotics.    Low-Fat Diet  BREADS, CEREALS, PASTA, RICE, DRIED PEAS, AND BEANS These products are high in carbohydrates and most are low in fat. Therefore, they can be increased  in the diet as substitutes for fatty foods. They too, however, contain calories and should not be eaten in excess. Cereals can be eaten for snacks as well as for breakfast.  Include foods that contain fiber (fruits, vegetables, whole grains, and legumes). Research shows that fiber may lower blood cholesterol levels, especially the water-soluble fiber found in fruits, vegetables, oat products, and legumes.  FRUITS AND VEGETABLES It is good to eat fruits and vegetables. Besides being sources of fiber, both are  rich in vitamins and some minerals. They help you get the daily allowances of these nutrients. Fruits and vegetables can be used for snacks and desserts.  MEATS Limit lean meat, chicken, [place], and fish to no more than 6 ounces per day.  Beef, Pork, and Lamb Use lean cuts of beef, pork, and lamb. Lean cuts include:  Extra-lean ground beef.  Arm roast.  Sirloin tip.  Center-cut ham.  Round steak.  Loin chops.  Rump roast.  Tenderloin.  Trim all fat off the outside of meats before cooking. It is not necessary to severely decrease the intake of red meat, but lean choices should be made. Lean meat is rich in protein and contains a highly absorbable form of iron. Premenopausal women, in particular, should avoid reducing lean red meat because this could increase the risk for low red blood cells (iron-deficiency anemia).  Chicken and [place] These are good sources of protein. The fat of poultry can be reduced by removing the skin and underlying fat layers before cooking. Chicken and [place] can be substituted for lean red meat in the diet. Poultry should not be fried or covered with high-fat sauces.  Fish and Shellfish Fish is a good source of protein. Shellfish contain cholesterol, but they usually are low in saturated fatty acids. The preparation of fish is important. Like chicken and [place], they should not be fried or covered with high-fat sauces.  EGGS Egg whites contain no fat or cholesterol. They can be eaten often. Try 1 to 2 egg whites instead of whole eggs in recipes or use egg substitutes that do not contain yolk.  MILK AND DAIRY PRODUCTS Use skim or 1% milk instead of 2% or whole milk. Decrease whole milk, natural, and processed cheeses. Use nonfat or low-fat (2%) cottage cheese or low-fat cheeses made from vegetable oils. Choose nonfat or low-fat (1 to 2%) yogurt. Experiment with evaporated skim milk in recipes that call for heavy cream. Substitute low-fat yogurt or low-fat cottage  cheese for sour cream in dips and salad dressings. Have at least 2 servings of low-fat dairy products, such as 2 glasses of skim (or 1%) milk each day to help get your daily calcium intake.  FATS AND OILS Reduce the total intake of fats, especially saturated fat. Butterfat, lard, and beef fats are high in saturated fat and cholesterol. These should be avoided as much as possible. Vegetable fats do not contain cholesterol, but certain vegetable fats, such as coconut oil, palm oil, and palm kernel oil are very high in saturated fats. These should be limited. These fats are often used in bakery goods, processed foods, popcorn, oils, and nondairy creamers. Vegetable shortenings and some peanut butters contain hydrogenated oils, which are also saturated fats. Read the labels on these foods and check for saturated vegetable oils.  Unsaturated vegetable oils and fats do not raise blood cholesterol. However, they should be limited because they are fats and are high in calories. Total fat should still be limited to 30% of your  daily caloric intake. Desirable liquid vegetable oils are corn oil, cottonseed oil, olive oil, canola oil, safflower oil, soybean oil, and sunflower oil. Peanut oil is not as good, but small amounts are acceptable. Buy a heart-healthy tub margarine that has no partially hydrogenated oils in the ingredients. Mayonnaise and salad dressings often are made from unsaturated fats, but they should also be limited because of their high calorie and fat content. Seeds, nuts, peanut butter, olives, and avocados are high in fat, but the fat is mainly the unsaturated type. These foods should be limited mainly to avoid excess calories and fat.  OTHER EATING TIPS Snacks  Most sweets should be limited as snacks. They tend to be rich in calories and fats, and their caloric content outweighs their nutritional value. Some good choices in snacks are graham crackers, melba toast, soda crackers, bagels (no egg),  English muffins, fruits, and vegetables. These snacks are preferable to snack crackers, [place] fries, and chips. Popcorn should be air-popped or cooked in small amounts of liquid vegetable oil.  Desserts Eat fruit, low-fat yogurt, and fruit ices instead of pastries, cake, and cookies. Sherbet, angel food cake, gelatin dessert, frozen low-fat yogurt, or other frozen products that do not contain saturated fat (pure fruit juice bars, frozen ice pops) are also acceptable.   COOKING METHODS Choose those methods that use little or no fat. They include: Poaching.  Braising.  Steaming.  Grilling.  Baking.  Stir-frying.  Broiling.  Microwaving.  Foods can be cooked in a nonstick pan without added fat, or use a nonfat cooking spray in regular cookware. Limit fried foods and avoid frying in saturated fat. Add moisture to lean meats by using water, broth, cooking wines, and other nonfat or low-fat sauces along with the cooking methods mentioned above. Soups and stews should be chilled after cooking. The fat that forms on top after a few hours in the refrigerator should be skimmed off. When preparing meals, avoid using excess salt. Salt can contribute to raising blood pressure in some people.  EATING AWAY FROM HOME Order entres, potatoes, and vegetables without sauces or butter. When meat exceeds the size of a deck of cards (3 to 4 ounces), the rest can be taken home for another meal. Choose vegetable or fruit salads and ask for low-calorie salad dressings to be served on the side. Use dressings sparingly. Limit high-fat toppings, such as bacon, crumbled eggs, cheese, sunflower seeds, and olives. Ask for heart-healthy tub margarine instead of butter.

## 2015-02-03 NOTE — Progress Notes (Signed)
REVIEWED-NO ADDITIONAL RECOMMENDATIONS. 

## 2015-02-03 NOTE — Op Note (Signed)
[facility] [address] [place], [address]   ENDOSCOPY PROCEDURE REPORT  PATIENT: Kelli, Garcia  MR#: [id number] BIRTHDATE: 05-30-1965 , 49  yrs. old GENDER: female  ENDOSCOPIST: West Bali, MD REFERRED BY:   Aggie Cosier, MD  PROCEDURE DATE: 2015/03/02 PROCEDURE:   EGD, screening  INDICATIONS:Multiple SUPERFICIAL ulcers seen in antrum on EGD JUN 2016.Marland Kitchen MEDICATIONS: Monitored anesthesia care TOPICAL ANESTHETIC:   Viscous Xylocaine ASA CLASS:  DESCRIPTION OF PROCEDURE:     Physical exam was performed.  Informed consent was obtained from the patient after explaining the benefits, risks, and alternatives to the procedure.  The patient was connected to the monitor and placed in the left lateral position.  Continuous oxygen was provided by nasal cannula and IV medicine administered through an indwelling cannula.  After administration of sedation, the patients esophagus was intubated and the     endoscope was advanced under direct visualization to the second portion of the duodenum.  The scope was removed slowly by carefully examining the color, texture, anatomy, and integrity of the mucosa on the way out.  The patient was recovered in endoscopy and discharged home in satisfactory condition.  Estimated blood loss is zero unless otherwise noted in this procedure report.     ESOPHAGUS: The mucosa of the esophagus appeared normal.   STOMACH: LIMITED VIEW OF FUNDUS DUE TO RETAINED FOOD.   OTHERWISE FEW AREAS WITH ERYTHEMA BUT NO ULCERS SEEN IN TEH ANTRUM.   DUODENUM: The duodenal mucosa showed no abnormalities in the bulb and 2nd part of the duodenum.  COMPLICATIONS: There were no immediate complications.  ENDOSCOPIC IMPRESSION: 1.   MILD GASTRITIS, ULCERS HEALED 2.   LIMITED VIEW OF FUNDUS DUE TO RETAINED FOOD  RECOMMENDATIONS: FOLLOW A LOW FAT DIET.  MEATS SHOULD BE BAKED, BROILED, OR BOILED. AVOID FRIED FOODS. AVOID TRIGGERS FOR  GASTRITIS. NEXIUM 30 MINUTES PRIOR TO MEALS TWICE DAILY FOR ONE MORE MONTH THEN ONCE DAILY FOREVER. FOLLOW UP IN 4 MOS.  REPEAT EXAM: eSigned:  West Bali, MD 03-02-2015 9:04 PM  CPT CODES: ICD CODES:  The ICD and CPT codes recommended by this software are interpretations from the data that the clinical staff has captured with the software.  The verification of the translation of this report to the ICD and CPT codes and modifiers is the sole responsibility of the health care institution and practicing physician where this report was generated.  [employer] will not be held responsible for the validity of the ICD and CPT codes included on this report.  AMA assumes no liability for data contained or not contained herein. CPT is a Publishing rights manager of the [employer].

## 2015-02-03 NOTE — Anesthesia Preprocedure Evaluation (Signed)
Anesthesia Evaluation  Patient identified by MRN, date of birth, ID band Patient awake    Reviewed: Allergy & Precautions, NPO status , Patient's Chart, lab work & pertinent test results  Airway Mallampati: I  TM Distance: >3 FB     Dental  (+) Edentulous Upper, Poor Dentition, Chipped, Loose, Missing, Dental Advisory Given,    Pulmonary former smoker,  breath sounds clear to auscultation        Cardiovascular negative cardio ROS  Rhythm:Regular Rate:Normal     Neuro/Psych  Headaches, PSYCHIATRIC DISORDERS Anxiety    GI/Hepatic GERD-  Controlled and Medicated,  Endo/Other    Renal/GU      Musculoskeletal   Abdominal   Peds  Hematology  (+) anemia , HIV,   Anesthesia Other Findings   Reproductive/Obstetrics                             Anesthesia Physical Anesthesia Plan  ASA: III  Anesthesia Plan: MAC   Post-op Pain Management:    Induction: Intravenous  Airway Management Planned: Simple Face Mask  Additional Equipment:   Intra-op Plan:   Post-operative Plan:   Informed Consent: I have reviewed the patients History and Physical, chart, labs and discussed the procedure including the risks, benefits and alternatives for the proposed anesthesia with the patient or authorized representative who has indicated his/her understanding and acceptance.     Plan Discussed with:   Anesthesia Plan Comments:         Anesthesia Quick Evaluation  

## 2015-02-03 NOTE — Transfer of Care (Signed)
Immediate Anesthesia Transfer of Care Note  Patient: Kelli Garcia  Procedure(s) Performed: Procedure(s) with comments: ESOPHAGOGASTRODUODENOSCOPY (EGD) WITH PROPOFOL (N/A) - 1100  Patient Location: PACU  Anesthesia Type:MAC  Level of Consciousness: awake, alert  and oriented  Airway & Oxygen Therapy: Patient Spontanous Breathing  Post-op Assessment: Report given to RN  Post vital signs: Reviewed and stable  Last Vitals:  Filed Vitals:   02/03/15 1110  BP: 102/61  Pulse: 75  Temp:   Resp: 22    Complications: No apparent anesthesia complications

## 2015-02-03 NOTE — H&P (Signed)
Primary Care Physician:  Aggie Cosier, MD Primary Gastroenterologist:  Dr. Darrick Penna  Pre-Procedure History & Physical: HPI:  Kelli Garcia is a 49 y.o. female here for EGD JUN 2016: PUD.  Past Medical History  Diagnosis Date  . Clostridium difficile colitis   . HIV (human immunodeficiency virus infection)   . Diarrhea   . Anxiety   . Hypokalemia   . Thrush   . Helicobacter pylori gastritis   . Barrett's esophagus   . Disc disorder   . Migraine headache   . Postoperative edema   . Iron deficiency anemia   . History of blood transfusion   . PUD (peptic ulcer disease)     Bleeding ulcers    Past Surgical History  Procedure Laterality Date  . 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 Esophagogastroduodenoscopy endoscopy  02/03/2009    Duodenitis with hyperplasia and dilation of brunner's glands, H. Pylori gastritis, Barrett's esophagus  . Esophagogastroduodenoscopy  2012    At [facility]: ok (per patient)  . Endarterectomy  05/11/2012    benign duodenal mucosa, increased eosinophils in lamina propria; Esophagus benign squamous mucosa with increased eosinophils and fucal exudate  . Flexible sigmoidoscopy  05/11/2012    Benign colonic mucosa with suggestion of erosive features  . Ercp  2010    stents placed in bile duct after lap chole by Dr. Karilyn Cota at [facility]  . Cholecystectomy  [facility]  . Liver biopsy    . Colonoscopy with propofol N/A 10/28/2014    ZOX:[id number] internal hemorrhoids  . Esophagogastroduodenoscopy (egd) with propofol N/A 10/28/2014    [id number] erosive gastritis and duodenitis  . Esophageal biopsy N/A 10/28/2014    Procedure: BIOPSY;  Surgeon: West Bali, MD;  Location: [facility];  Service: Endoscopy;  Laterality: N/A;    Prior to Admission medications   Medication Sig Start Date End Date Taking? Authorizing Provider  ALPRAZolam Prudy Feeler) 1 MG tablet Take 1 mg by mouth 3 (three) times daily as needed for anxiety.    Yes Historical Provider, MD  cyclobenzaprine (FLEXERIL) 10 MG tablet Take 10 mg by mouth 3 (three) times daily as needed for muscle spasms.   Yes Historical Provider, MD  Darunavir Ethanolate (PREZISTA) 800 MG tablet Take 800 mg by mouth daily.   Yes Historical Provider, MD  diphenoxylate-atropine (LOMOTIL) 2.5-0.025 MG per tablet Take by mouth 4 (four) times daily as needed for diarrhea or loose stools.   Yes Historical Provider, MD  Dolutegravir Sodium (TIVICAY PO) Take 50 mg by mouth 2 (two) times daily.    Yes Historical Provider, MD  Doxepin HCl 3 MG TABS Take by mouth. One tablet nightly   Yes Historical Provider, MD  emtricitabine (EMTRIVA) 200 MG capsule Take 200 mg by mouth daily.   Yes Historical Provider, MD  esomeprazole (NEXIUM) 40 MG capsule Take 1 capsule (40 mg total) by mouth 2 (two) times daily before a meal. 10/28/14  Yes West Bali, MD  hydrOXYzine (ATARAX/VISTARIL) 25 MG tablet Take 25 mg by mouth 3 (three) times daily as needed. Takes one daily   Yes Historical Provider, MD  NON Selina Cooley   One tablet daily for sinus   Yes Historical Provider, MD  ondansetron (ZOFRAN) 4 MG tablet Take 4 mg by mouth every 8 (eight) hours as needed for nausea or vomiting.   Yes Historical Provider, MD  potassium chloride (K-DUR) 10 MEQ tablet Take 10 mEq by mouth daily.  Yes Historical Provider, MD  PROCTOZONE-HC 2.5 % rectal cream APPLY A THIN FILM TO AFFECTED AREA 2 TO 4 TIMES DAILY AS DIRECTED 12/30/14  Yes Historical Provider, MD  ritonavir (NORVIR) 100 MG capsule Take by mouth daily with breakfast.   Yes Historical Provider, MD  SUMAtriptan (IMITREX) 25 MG tablet Take 25 mg by mouth every 2 (two) hours as needed for migraine. May repeat in 2 hours if headache persists or recurs.   Yes Historical Provider, MD  furosemide (LASIX) 20 MG tablet Take 20 mg by mouth. As needed    Historical Provider, MD    Allergies as of 01/07/2015 - Review Complete 01/07/2015  Allergen Reaction Noted   . Abacavir Other (See Comments) 10/07/2014  . Betadine [povidone iodine] Itching 10/07/2014  . Viread [tenofovir disoproxil fumarate] Other (See Comments) 10/07/2014  . Clarithromycin Diarrhea 10/17/2014    Family History  Problem Relation Age of Onset  . Colon cancer Neg Hx   . Heart disease Mother   . Lung cancer Father   . Mesothelioma Father     Social History   Social History  . Marital Status: Divorced    Spouse Name: N/A  . Number of Children: N/A  . Years of Education: N/A   Occupational History  . Not on file.   Social History Main Topics  . Smoking status: Former Smoker -- 0.25 packs/day for 4 years    Types: Cigarettes    Quit date: 10/21/2013  . Smokeless tobacco: Never Used     Comment: smoked about 4 cigarettes for 4 years-pt has quit  . Alcohol Use: No  . Drug Use: No  . Sexual Activity: Not on file   Other Topics Concern  . Not on file   Social History Narrative    Review of Systems: See HPI, otherwise negative ROS   Physical Exam: BP 91/68 mmHg  Pulse 84  Temp(Src) 97.8 F (36.6 C) (Oral)  Ht  (1.651 m)  Wt 134 lb (60.782 kg)  BMI 22.30 kg/m2  SpO2 98% General:   Alert,  pleasant and cooperative in NAD Head:  Normocephalic and atraumatic. Neck:  Supple; Lungs:  Clear throughout to auscultation.    Heart:  Regular rate and rhythm. Abdomen:  Soft, nontender and nondistended. Normal bowel sounds, without guarding, and without rebound.   Neurologic:  Alert and  oriented x4;  grossly normal neurologically.  Impression/Plan:     PUD JUN 2016  PLAN:  REPEAT EGD TO CONFIRM ULCERS ARE HEALED.

## 2015-02-03 NOTE — Anesthesia Postprocedure Evaluation (Signed)
  Anesthesia Post-op Note  Patient: Kelli Garcia  Procedure(s) Performed: Procedure(s) with comments: ESOPHAGOGASTRODUODENOSCOPY (EGD) WITH PROPOFOL (N/A) - 1100  Patient Location: PACU  Anesthesia Type:MAC  Level of Consciousness: awake, alert  and oriented  Airway and Oxygen Therapy: Patient Spontanous Breathing  Post-op Pain: none  Post-op Assessment: Post-op Vital signs reviewed, Patient's Cardiovascular Status Stable, Respiratory Function Stable, Patent Airway and No signs of Nausea or vomiting              Post-op Vital Signs: Reviewed and stable  Last Vitals:  Filed Vitals:   02/03/15 1110  BP: 102/61  Pulse: 75  Temp:   Resp: 22    Complications: No apparent anesthesia complications

## 2015-02-04 ENCOUNTER — Encounter (HOSPITAL_COMMUNITY): Payer: Self-pay | Admitting: Gastroenterology

## 2015-02-12 ENCOUNTER — Encounter: Payer: Medicare Other | Admitting: Gastroenterology

## 2015-02-25 ENCOUNTER — Encounter: Payer: Medicare Other | Admitting: Gastroenterology

## 2015-04-08 ENCOUNTER — Telehealth: Payer: Self-pay | Admitting: Gastroenterology

## 2015-04-08 NOTE — Telephone Encounter (Signed)
Called pt and LMOM to call office back  

## 2015-04-08 NOTE — Telephone Encounter (Signed)
Pt called asking to be scheduled for a pill cam. She is confused because her oncology doctor told her that we do them here in the office and I told her that we didn't. She is wanting to get this scheduled. Patient's call dropped before I could tell her that CJ would be calling her back. Please call her at [phone]

## 2015-04-09 ENCOUNTER — Ambulatory Visit: Payer: Medicare Other | Admitting: Nurse Practitioner

## 2015-04-10 NOTE — Telephone Encounter (Signed)
Called pt- LMOM 

## 2015-04-13 NOTE — Telephone Encounter (Signed)
Pt called office back and states she will contact her oncology office and have them fax over recommendation for a pill study

## 2015-04-14 NOTE — Telephone Encounter (Signed)
Paper work is in [employer] chair

## 2015-05-06 ENCOUNTER — Encounter: Payer: Self-pay | Admitting: Gastroenterology

## 2015-06-08 ENCOUNTER — Telehealth: Payer: Self-pay | Admitting: Gastroenterology

## 2015-06-08 NOTE — Telephone Encounter (Signed)
PATIENT ASKING ABOUT THE CAPSULE STUDY HER ONCOLOGIST REFERRED HER TO HAVE HERE.  HE SENT THE REFERRAL IN November AND PATIENT HAS NOT HEARD ANYTHING

## 2015-06-08 NOTE — Telephone Encounter (Signed)
See phone note from 03/2015   Routing to Dr. Darrick PennaFields

## 2015-06-10 NOTE — Telephone Encounter (Signed)
TRIED CELL PHONE. PT NOT AVAILABLE. NO VM SETUP.

## 2015-06-10 NOTE — Telephone Encounter (Signed)
Called patient TO DISCUSS CONCERNS. LVM-CALL [phone] TO DISCUSS. WILL CONTACT ONCOLOGY. REFERRAL FOR CAPSULE UNAVAILBLE. WILL NEED TO SPEAK TO HEMATOLOGY AND EXPEDITE PROCESS.

## 2015-06-11 ENCOUNTER — Telehealth: Payer: Self-pay | Admitting: Gastroenterology

## 2015-06-11 ENCOUNTER — Other Ambulatory Visit: Payer: Self-pay

## 2015-06-11 DIAGNOSIS — D509 Iron deficiency anemia, unspecified: Secondary | ICD-10-CM

## 2015-06-11 NOTE — Telephone Encounter (Signed)
Pt is set up for givens on 06/24/15 @ 7:00. Instructions are in the mail.

## 2015-06-11 NOTE — Telephone Encounter (Signed)
Called patient TO DISCUSS CONCERNS. HAS TRANSFUSION DEPENDENT ANEMIA/IV FE REQUIREMENT. HEMATOLOGY REQUESTS GIVENS. HEMATOLOGY: [facility] BACK IN OCT 2016. FOUND REFERRAL.  WILL ARRANGE GIVENS IN 2 WEEKS PER PT REQUEST, DX: OBSCURE GI BLEED. IRON DEFICIENCY ANEMIA.Kelli Garcia

## 2015-06-11 NOTE — Telephone Encounter (Signed)
Pt was returning call to [facility]. [phone]

## 2015-06-12 NOTE — Telephone Encounter (Signed)
Dr. Darrick Penna and Ginger spoke with pt and pt has been set up for Givens capsule

## 2015-07-16 ENCOUNTER — Encounter (HOSPITAL_COMMUNITY): Payer: Self-pay | Admitting: *Deleted

## 2015-07-16 ENCOUNTER — Encounter (HOSPITAL_COMMUNITY)
Admission: RE | Disposition: A | Discharge status: Home / Self Care | Payer: Self-pay | Source: Ambulatory Visit | Attending: Gastroenterology

## 2015-07-16 ENCOUNTER — Ambulatory Visit (HOSPITAL_COMMUNITY)
Admission: RE | Admit: 2015-07-16 | Discharge: 2015-07-16 | Disposition: A | Discharge status: Home / Self Care | Payer: Medicare Other | Source: Ambulatory Visit | Attending: Gastroenterology | Admitting: Gastroenterology

## 2015-07-16 DIAGNOSIS — D509 Iron deficiency anemia, unspecified: Secondary | ICD-10-CM

## 2015-07-16 DIAGNOSIS — D500 Iron deficiency anemia secondary to blood loss (chronic): Secondary | ICD-10-CM | POA: Insufficient documentation

## 2015-07-16 HISTORY — PX: GIVENS CAPSULE STUDY: SHX5432

## 2015-07-16 SURGERY — IMAGING PROCEDURE, GI TRACT, INTRALUMINAL, VIA CAPSULE

## 2015-07-17 ENCOUNTER — Encounter (HOSPITAL_COMMUNITY): Payer: Self-pay | Admitting: Gastroenterology

## 2015-07-22 ENCOUNTER — Telehealth: Payer: Self-pay | Admitting: Gastroenterology

## 2015-07-22 NOTE — Telephone Encounter (Signed)
PLEASE CALL PT. HER RESULTS WILL BE AVAILABLE BY MON MAR 6.

## 2015-07-22 NOTE — Telephone Encounter (Signed)
Pt called to check on her pill cam results.

## 2015-07-22 NOTE — Telephone Encounter (Signed)
Forwarding to Dr. Fields for results.  

## 2015-07-23 NOTE — Telephone Encounter (Signed)
Pt is aware and would like the info sent to the following doctors when available.  Dr. Sela Hilding   Fax # [phone]   ( phone number   [phone]) Dr. Stefan Church   Fax # [phone]  Sending to Darl Pikes so she will have the information.

## 2015-07-27 ENCOUNTER — Telehealth: Payer: Self-pay | Admitting: Gastroenterology

## 2015-07-27 NOTE — Telephone Encounter (Signed)
Pt was calling to see if her pill cam results were available. I told her that the nurse said that she hasn't received anything yet and once I get them I am to fax them to 2 of her doctors.

## 2015-07-27 NOTE — Procedures (Signed)
  RESULTS: LIMITED views of gastric mucosa due to retained contents. No blood in the stomach. RARE EROSION IN JEJUNUM(06:24:38). No masses,ULCERS, or AVMs SEEN. NO OLD BLOOD OR FRESH BLOOD SEEN IN SMALL BOWEL. LIMITED VIEWS OF THE COLON DUE TO RETAINED CONTENTS.  DIAGNOSIS: IRON DEFICIENCY ANEMIA OF UNCERTAIN ETIOLOGY-NO SOURCE FOR ANEMIA IDENTIFIED  Plan: 1.  FOLLOW UP WITH HEMATOLOGY. NO ADDITIONAL GI WORKUP IS NEEDED AT THIS TIME. 2.  OPV IN  AUG 2017 E30 FEDA

## 2015-07-27 NOTE — Telephone Encounter (Signed)
Forwarding to Dr.Fields for the results.

## 2015-07-28 NOTE — Telephone Encounter (Addendum)
PLEASE CALL PT. HER GIVENS TEST IS ESSENTIALLY NORMAL. SHE SHOULD FOLLOW UP WITH HER HEMATOLOGY DOCTORS. NO ADDITIONAL GI WORKUP IS NEEDED AT THIS TIME. PT SHOULD CALL IF SHE DEVELOPS RECTAL BLEEDING OR BLACK TARRY STOOLS. OPV AUG 2017 E30 FEDA.

## 2015-07-29 ENCOUNTER — Encounter: Payer: Self-pay | Admitting: Gastroenterology

## 2015-07-29 NOTE — Telephone Encounter (Signed)
LMOM to call.

## 2015-07-29 NOTE — Telephone Encounter (Signed)
I have faxed report to Dr Celine Mansesai and Dr Regan LemmingAkinleye

## 2015-07-29 NOTE — Telephone Encounter (Signed)
done

## 2015-07-29 NOTE — Telephone Encounter (Signed)
Pt is aware and said to route to Kelli Garcia to send to her other doctors.

## 2015-07-29 NOTE — Telephone Encounter (Signed)
APPT MADE AND LETTER SENT  °

## 2015-12-03 ENCOUNTER — Other Ambulatory Visit: Payer: Self-pay | Admitting: Gastroenterology

## 2015-12-29 ENCOUNTER — Ambulatory Visit: Payer: Medicare Other | Admitting: Nurse Practitioner

## 2016-04-01 ENCOUNTER — Other Ambulatory Visit: Payer: Self-pay | Admitting: Gastroenterology

## 2016-07-26 ENCOUNTER — Other Ambulatory Visit: Payer: Self-pay | Admitting: Gastroenterology

## 2017-08-29 ENCOUNTER — Other Ambulatory Visit: Payer: Self-pay | Admitting: Nurse Practitioner

## 2017-10-23 ENCOUNTER — Other Ambulatory Visit: Payer: Self-pay | Admitting: Gastroenterology

## 2017-10-24 ENCOUNTER — Encounter: Payer: Self-pay | Admitting: Gastroenterology

## 2017-10-24 NOTE — Telephone Encounter (Signed)
Could not reach pt by phone, numbers not working. I have mailed her a letter to explain and told her she will receive an appt in the mail.  Misty StanleyStacey, please schedule.

## 2017-10-24 NOTE — Telephone Encounter (Signed)
PATIENT SCHEDULED AND LETTER SENT  °

## 2017-10-24 NOTE — Telephone Encounter (Signed)
Limited refills. She needs ov, last seen more than 2 years ago.

## 2018-01-24 ENCOUNTER — Encounter: Payer: Self-pay | Admitting: Gastroenterology

## 2018-01-24 ENCOUNTER — Telehealth: Payer: Self-pay | Admitting: Gastroenterology

## 2018-01-24 ENCOUNTER — Ambulatory Visit: Payer: Medicare Other | Admitting: Gastroenterology

## 2018-01-24 NOTE — Telephone Encounter (Signed)
PATIENT WAS A NO SHOW AND LETTER SENT  °

## 2018-04-23 ENCOUNTER — Telehealth: Payer: Self-pay

## 2018-04-23 NOTE — Telephone Encounter (Signed)
Noted  

## 2018-04-23 NOTE — Telephone Encounter (Signed)
Received a refill request from [facility] in [place] [facility] for Esomeprazole 40 mg bid. Pt last seen in office in 12/2014. Forwarding to refill box for advise.

## 2018-04-23 NOTE — Telephone Encounter (Signed)
Needs office visit prior to refills.

## 2018-04-23 NOTE — Telephone Encounter (Signed)
PT is aware, was not aware the refill request was sent to us. Dr. Celine Mansesai has been filling it for her. She said actually, she would like to cancel the appt in Dec. She doesn't feel she needs to come in now.

## 2018-04-24 ENCOUNTER — Other Ambulatory Visit: Payer: Self-pay | Admitting: Gastroenterology

## 2018-05-11 ENCOUNTER — Ambulatory Visit: Payer: Medicare Other | Admitting: Gastroenterology

## 2023-11-07 ENCOUNTER — Encounter: Payer: Self-pay | Admitting: Internal Medicine
